# Patient Record
Sex: Female | Born: 1970 | Race: Black or African American | Hispanic: No | Marital: Married | Smoking: Current every day smoker
Health system: Southern US, Community
[De-identification: ages and names within clinical notes are randomized; demographics above are authoritative.]

## PROBLEM LIST (undated history)

## (undated) DIAGNOSIS — S42302A Unspecified fracture of shaft of humerus, left arm, initial encounter for closed fracture: Secondary | ICD-10-CM

## (undated) DIAGNOSIS — F1010 Alcohol abuse, uncomplicated: Secondary | ICD-10-CM

## (undated) DIAGNOSIS — D649 Anemia, unspecified: Secondary | ICD-10-CM

## (undated) DIAGNOSIS — S42301A Unspecified fracture of shaft of humerus, right arm, initial encounter for closed fracture: Secondary | ICD-10-CM

## (undated) DIAGNOSIS — S8291XA Unspecified fracture of right lower leg, initial encounter for closed fracture: Secondary | ICD-10-CM

## (undated) HISTORY — PX: ELBOW SURGERY: SHX618

## (undated) HISTORY — DX: Unspecified fracture of shaft of humerus, right arm, initial encounter for closed fracture: S42.301A

## (undated) HISTORY — DX: Unspecified fracture of shaft of humerus, left arm, initial encounter for closed fracture: S42.302A

## (undated) HISTORY — DX: Anemia, unspecified: D64.9

## (undated) HISTORY — DX: Unspecified fracture of right lower leg, initial encounter for closed fracture: S82.91XA

---

## 2000-06-16 ENCOUNTER — Encounter: Payer: Self-pay | Admitting: Emergency Medicine

## 2000-06-16 ENCOUNTER — Emergency Department (HOSPITAL_COMMUNITY): Admission: EM | Admit: 2000-06-16 | Discharge: 2000-06-16 | Payer: Self-pay | Admitting: Emergency Medicine

## 2006-06-02 ENCOUNTER — Emergency Department: Payer: Self-pay | Admitting: Emergency Medicine

## 2006-12-08 ENCOUNTER — Emergency Department: Payer: Self-pay | Admitting: Unknown Physician Specialty

## 2007-03-03 ENCOUNTER — Inpatient Hospital Stay: Payer: Self-pay | Admitting: Specialist

## 2008-01-08 ENCOUNTER — Inpatient Hospital Stay: Payer: Self-pay | Admitting: Specialist

## 2008-06-19 ENCOUNTER — Ambulatory Visit: Payer: Self-pay | Admitting: Specialist

## 2014-11-02 ENCOUNTER — Emergency Department: Payer: Self-pay | Admitting: Emergency Medicine

## 2017-06-20 ENCOUNTER — Encounter
Admission: EM | Disposition: A | Discharge status: Home Health | Payer: Self-pay | Source: Home / Self Care | Attending: Orthopedic Surgery

## 2017-06-20 ENCOUNTER — Inpatient Hospital Stay: Payer: Medicaid Other | Admitting: Anesthesiology

## 2017-06-20 ENCOUNTER — Encounter: Payer: Self-pay | Admitting: Emergency Medicine

## 2017-06-20 ENCOUNTER — Inpatient Hospital Stay: Payer: Medicaid Other

## 2017-06-20 ENCOUNTER — Inpatient Hospital Stay
Admission: EM | Admit: 2017-06-20 | Discharge: 2017-06-23 | DRG: 493 | Disposition: A | Discharge status: Home Health | Payer: Medicaid Other | Attending: Orthopedic Surgery | Admitting: Orthopedic Surgery

## 2017-06-20 ENCOUNTER — Emergency Department: Payer: Medicaid Other

## 2017-06-20 DIAGNOSIS — S82451A Displaced comminuted fracture of shaft of right fibula, initial encounter for closed fracture: Secondary | ICD-10-CM | POA: Diagnosis present

## 2017-06-20 DIAGNOSIS — T148XXA Other injury of unspecified body region, initial encounter: Secondary | ICD-10-CM

## 2017-06-20 DIAGNOSIS — S82209A Unspecified fracture of shaft of unspecified tibia, initial encounter for closed fracture: Secondary | ICD-10-CM | POA: Diagnosis present

## 2017-06-20 DIAGNOSIS — Z01811 Encounter for preprocedural respiratory examination: Secondary | ICD-10-CM

## 2017-06-20 DIAGNOSIS — F10239 Alcohol dependence with withdrawal, unspecified: Secondary | ICD-10-CM | POA: Diagnosis not present

## 2017-06-20 DIAGNOSIS — S82251A Displaced comminuted fracture of shaft of right tibia, initial encounter for closed fracture: Principal | ICD-10-CM | POA: Diagnosis present

## 2017-06-20 DIAGNOSIS — Y92410 Unspecified street and highway as the place of occurrence of the external cause: Secondary | ICD-10-CM

## 2017-06-20 DIAGNOSIS — D509 Iron deficiency anemia, unspecified: Secondary | ICD-10-CM | POA: Diagnosis present

## 2017-06-20 DIAGNOSIS — F10229 Alcohol dependence with intoxication, unspecified: Secondary | ICD-10-CM | POA: Diagnosis present

## 2017-06-20 DIAGNOSIS — E876 Hypokalemia: Secondary | ICD-10-CM | POA: Diagnosis not present

## 2017-06-20 DIAGNOSIS — F17210 Nicotine dependence, cigarettes, uncomplicated: Secondary | ICD-10-CM | POA: Diagnosis present

## 2017-06-20 DIAGNOSIS — Y907 Blood alcohol level of 200-239 mg/100 ml: Secondary | ICD-10-CM | POA: Diagnosis present

## 2017-06-20 HISTORY — PX: TIBIA IM NAIL INSERTION: SHX2516

## 2017-06-20 HISTORY — DX: Alcohol abuse, uncomplicated: F10.10

## 2017-06-20 LAB — BASIC METABOLIC PANEL
ANION GAP: 4 — AB (ref 5–15)
Anion gap: 9 (ref 5–15)
BUN: 7 mg/dL (ref 6–20)
BUN: 7 mg/dL (ref 6–20)
CO2: 23 mmol/L (ref 22–32)
CO2: 27 mmol/L (ref 22–32)
CREATININE: 0.7 mg/dL (ref 0.44–1.00)
Calcium: 8.3 mg/dL — ABNORMAL LOW (ref 8.9–10.3)
Calcium: 8.1 mg/dL — ABNORMAL LOW (ref 8.9–10.3)
Chloride: 110 mmol/L (ref 101–111)
Chloride: 109 mmol/L (ref 101–111)
Creatinine, Ser: 0.62 mg/dL (ref 0.44–1.00)
GFR calc Af Amer: >60 mL/min (ref >60)
GFR calc non Af Amer: >60 mL/min (ref >60)
GLUCOSE: 94 mg/dL (ref 65–99)
Glucose, Bld: 111 mg/dL — ABNORMAL HIGH (ref 65–99)
POTASSIUM: 3.5 mmol/L (ref 3.5–5.1)
Potassium: 3.4 mmol/L — ABNORMAL LOW (ref 3.5–5.1)
SODIUM: 140 mmol/L (ref 135–145)
Sodium: 142 mmol/L (ref 135–145)

## 2017-06-20 LAB — ETHANOL: Alcohol, Ethyl (B): 207 mg/dL — ABNORMAL HIGH (ref <10)

## 2017-06-20 LAB — APTT: aPTT: 27 seconds (ref 24–36)

## 2017-06-20 LAB — URINE DRUG SCREEN, QUALITATIVE (ARMC ONLY)
Amphetamines, Ur Screen: NOT DETECTED
Barbiturates, Ur Screen: NOT DETECTED
Benzodiazepine, Ur Scrn: NOT DETECTED
CANNABINOID 50 NG, UR ~~LOC~~: POSITIVE — AB
COCAINE METABOLITE, UR ~~LOC~~: NOT DETECTED
MDMA (ECSTASY) UR SCREEN: NOT DETECTED
Methadone Scn, Ur: NOT DETECTED
Opiate, Ur Screen: POSITIVE — AB
Phencyclidine (PCP) Ur S: NOT DETECTED
TRICYCLIC, UR SCREEN: NOT DETECTED

## 2017-06-20 LAB — CBC WITH DIFFERENTIAL/PLATELET
BASOS PCT: 1 %
Basophils Absolute: 0 10*3/uL (ref 0–0.1)
EOS ABS: 0.1 10*3/uL (ref 0–0.7)
EOS PCT: 2 %
HCT: 27.3 % — ABNORMAL LOW (ref 35.0–47.0)
HEMOGLOBIN: 8.4 g/dL — AB (ref 12.0–16.0)
LYMPHS ABS: 1.3 10*3/uL (ref 1.0–3.6)
Lymphocytes Relative: 36 %
MCH: 23.7 pg — AB (ref 26.0–34.0)
MCHC: 30.8 g/dL — AB (ref 32.0–36.0)
MCV: 77.1 fL — AB (ref 80.0–100.0)
MONOS PCT: 13 %
Monocytes Absolute: 0.5 10*3/uL (ref 0.2–0.9)
NEUTROS PCT: 48 %
Neutro Abs: 1.7 10*3/uL (ref 1.4–6.5)
Platelets: 321 10*3/uL (ref 150–440)
RBC: 3.54 MIL/uL — ABNORMAL LOW (ref 3.80–5.20)
RDW: 18.3 % — ABNORMAL HIGH (ref 11.5–14.5)
WBC: 3.6 10*3/uL (ref 3.6–11.0)

## 2017-06-20 LAB — PROTIME-INR
INR: 1.04
PROTHROMBIN TIME: 13.5 s (ref 11.4–15.2)

## 2017-06-20 LAB — CBC
HCT: 26.2 % — ABNORMAL LOW (ref 35.0–47.0)
Hemoglobin: 8.3 g/dL — ABNORMAL LOW (ref 12.0–16.0)
MCH: 24.1 pg — ABNORMAL LOW (ref 26.0–34.0)
MCHC: 31.6 g/dL — ABNORMAL LOW (ref 32.0–36.0)
MCV: 76.3 fL — AB (ref 80.0–100.0)
PLATELETS: 351 10*3/uL (ref 150–440)
RBC: 3.44 MIL/uL — AB (ref 3.80–5.20)
RDW: 18.4 % — ABNORMAL HIGH (ref 11.5–14.5)
WBC: 6 10*3/uL (ref 3.6–11.0)

## 2017-06-20 LAB — SURGICAL PCR SCREEN
MRSA, PCR: NEGATIVE
Staphylococcus aureus: POSITIVE — AB

## 2017-06-20 LAB — PREGNANCY, URINE: Preg Test, Ur: NEGATIVE

## 2017-06-20 SURGERY — INSERTION, INTRAMEDULLARY ROD, TIBIA
Anesthesia: General | Site: Leg Lower | Laterality: Right | Wound class: Clean

## 2017-06-20 MED ORDER — LORAZEPAM 2 MG/ML IJ SOLN: 1.0000 mg | Freq: Four times a day (QID) | INTRAMUSCULAR | Status: AC | PRN

## 2017-06-20 MED ORDER — FLEET ENEMA 7-19 GM/118ML RE ENEM: 1.0000 | ENEMA | Freq: Once | RECTAL | Status: DC | PRN

## 2017-06-20 MED ORDER — GLYCOPYRROLATE 0.2 MG/ML IJ SOLN
INTRAMUSCULAR | Status: AC
  Filled 2017-06-20: qty 1

## 2017-06-20 MED ORDER — SODIUM CHLORIDE 0.9 % IV SOLN
75.0000 mL/h | INTRAVENOUS | Status: DC
  Administered 2017-06-20: 75 mL/h via INTRAVENOUS

## 2017-06-20 MED ORDER — GLYCOPYRROLATE 0.2 MG/ML IJ SOLN
INTRAMUSCULAR | Status: DC | PRN
  Administered 2017-06-20: 0.2 mg via INTRAVENOUS

## 2017-06-20 MED ORDER — ACETAMINOPHEN 650 MG RE SUPP: 650.0000 mg | Freq: Four times a day (QID) | RECTAL | Status: DC | PRN

## 2017-06-20 MED ORDER — FOLIC ACID 1 MG PO TABS
1.0000 mg | ORAL_TABLET | Freq: Every day | ORAL | Status: DC
  Administered 2017-06-21 – 2017-06-23 (×3): 1 mg via ORAL
  Filled 2017-06-20 (×3): qty 1

## 2017-06-20 MED ORDER — ACETAMINOPHEN 325 MG PO TABS: 650.0000 mg | ORAL_TABLET | Freq: Four times a day (QID) | ORAL | Status: DC | PRN

## 2017-06-20 MED ORDER — LIDOCAINE HCL (PF) 2 % IJ SOLN
INTRAMUSCULAR | Status: AC
  Filled 2017-06-20: qty 10

## 2017-06-20 MED ORDER — CHLORHEXIDINE GLUCONATE CLOTH 2 % EX PADS
6.0000 | MEDICATED_PAD | Freq: Every day | CUTANEOUS | Status: DC
  Administered 2017-06-20: 6 via TOPICAL

## 2017-06-20 MED ORDER — ONDANSETRON HCL 4 MG/2ML IJ SOLN
4.0000 mg | Freq: Four times a day (QID) | INTRAMUSCULAR | Status: DC | PRN
Start: 2017-06-20 — End: 2017-06-23

## 2017-06-20 MED ORDER — DEXTROSE-NACL 5-0.9 % IV SOLN
INTRAVENOUS | Status: DC
  Administered 2017-06-20: 04:00:00 via INTRAVENOUS

## 2017-06-20 MED ORDER — MAGNESIUM CITRATE PO SOLN: 1.0000 | Freq: Once | ORAL | Status: DC | PRN

## 2017-06-20 MED ORDER — DEXAMETHASONE SODIUM PHOSPHATE 10 MG/ML IJ SOLN
INTRAMUSCULAR | Status: DC | PRN
  Administered 2017-06-20: 10 mg via INTRAVENOUS

## 2017-06-20 MED ORDER — DEXAMETHASONE SODIUM PHOSPHATE 10 MG/ML IJ SOLN
INTRAMUSCULAR | Status: AC
  Filled 2017-06-20: qty 1

## 2017-06-20 MED ORDER — FENTANYL CITRATE (PF) 100 MCG/2ML IJ SOLN
INTRAMUSCULAR | Status: AC
  Filled 2017-06-20: qty 2

## 2017-06-20 MED ORDER — MORPHINE SULFATE (PF) 2 MG/ML IV SOLN: 2.0000 mg | INTRAVENOUS | Status: DC | PRN

## 2017-06-20 MED ORDER — DOCUSATE SODIUM 100 MG PO CAPS
100.0000 mg | ORAL_CAPSULE | Freq: Two times a day (BID) | ORAL | Status: DC
Start: 2017-06-20 — End: 2017-06-23
  Administered 2017-06-20 – 2017-06-23 (×6): 100 mg via ORAL
  Filled 2017-06-20 (×6): qty 1

## 2017-06-20 MED ORDER — PHENYLEPHRINE HCL 10 MG/ML IJ SOLN
INTRAMUSCULAR | Status: DC | PRN
  Administered 2017-06-20 (×2): 100 ug via INTRAVENOUS
  Administered 2017-06-20 (×3): 200 ug via INTRAVENOUS
  Administered 2017-06-20: 100 ug via INTRAVENOUS

## 2017-06-20 MED ORDER — LORAZEPAM 1 MG PO TABS: 1.0000 mg | ORAL_TABLET | Freq: Four times a day (QID) | ORAL | Status: AC | PRN

## 2017-06-20 MED ORDER — METHOCARBAMOL 1000 MG/10ML IJ SOLN
500.0000 mg | Freq: Four times a day (QID) | INTRAVENOUS | Status: DC | PRN
  Filled 2017-06-20: qty 5

## 2017-06-20 MED ORDER — BISACODYL 10 MG RE SUPP: 10.0000 mg | Freq: Every day | RECTAL | Status: DC | PRN

## 2017-06-20 MED ORDER — POTASSIUM CHLORIDE CRYS ER 20 MEQ PO TBCR
40.0000 meq | EXTENDED_RELEASE_TABLET | Freq: Once | ORAL | Status: AC
  Administered 2017-06-20: 40 meq via ORAL
  Filled 2017-06-20: qty 2

## 2017-06-20 MED ORDER — FENTANYL CITRATE (PF) 100 MCG/2ML IJ SOLN
INTRAMUSCULAR | Status: DC | PRN
  Administered 2017-06-20 (×3): 50 ug via INTRAVENOUS
  Administered 2017-06-20 (×2): 100 ug via INTRAVENOUS
  Administered 2017-06-20: 50 ug via INTRAVENOUS

## 2017-06-20 MED ORDER — DOCUSATE SODIUM 100 MG PO CAPS: 100.0000 mg | ORAL_CAPSULE | Freq: Two times a day (BID) | ORAL | Status: DC

## 2017-06-20 MED ORDER — OXYCODONE HCL 5 MG PO TABS
10.0000 mg | ORAL_TABLET | ORAL | Status: DC | PRN
  Administered 2017-06-21 – 2017-06-22 (×3): 10 mg via ORAL
  Filled 2017-06-20 (×3): qty 2

## 2017-06-20 MED ORDER — SODIUM CHLORIDE 0.9 % IV SOLN
INTRAVENOUS | Status: DC | PRN
  Administered 2017-06-20: 50 ug/min via INTRAVENOUS

## 2017-06-20 MED ORDER — CEFAZOLIN SODIUM-DEXTROSE 2-4 GM/100ML-% IV SOLN: 2.0000 g | INTRAVENOUS | Status: DC

## 2017-06-20 MED ORDER — HYDROMORPHONE HCL 1 MG/ML IJ SOLN
INTRAMUSCULAR | Status: AC
  Filled 2017-06-20: qty 1

## 2017-06-20 MED ORDER — ONDANSETRON HCL 4 MG PO TABS: 4.0000 mg | ORAL_TABLET | Freq: Four times a day (QID) | ORAL | Status: DC | PRN

## 2017-06-20 MED ORDER — SENNA 8.6 MG PO TABS
1.0000 | ORAL_TABLET | Freq: Two times a day (BID) | ORAL | Status: DC
  Administered 2017-06-20 – 2017-06-23 (×6): 8.6 mg via ORAL
  Filled 2017-06-20 (×6): qty 1

## 2017-06-20 MED ORDER — CEFAZOLIN SODIUM 10 G IJ SOLR
2.0000 g | INTRAMUSCULAR | Status: AC
  Administered 2017-06-20: 2 g via INTRAVENOUS
  Filled 2017-06-20: qty 2000

## 2017-06-20 MED ORDER — PROPOFOL 10 MG/ML IV BOLUS
INTRAVENOUS | Status: DC | PRN
  Administered 2017-06-20: 170 mg via INTRAVENOUS
  Administered 2017-06-20 (×2): 30 mg via INTRAVENOUS

## 2017-06-20 MED ORDER — METHOCARBAMOL 500 MG PO TABS
500.0000 mg | ORAL_TABLET | Freq: Four times a day (QID) | ORAL | Status: DC | PRN
  Filled 2017-06-20: qty 1

## 2017-06-20 MED ORDER — MIDAZOLAM HCL 2 MG/2ML IJ SOLN
INTRAMUSCULAR | Status: AC
  Filled 2017-06-20: qty 2

## 2017-06-20 MED ORDER — HYDROMORPHONE HCL 1 MG/ML IJ SOLN
INTRAMUSCULAR | Status: DC | PRN
  Administered 2017-06-20 (×2): 0.5 mg via INTRAVENOUS

## 2017-06-20 MED ORDER — ONDANSETRON HCL 4 MG/2ML IJ SOLN
INTRAMUSCULAR | Status: DC | PRN
  Administered 2017-06-20: 4 mg via INTRAVENOUS

## 2017-06-20 MED ORDER — ROCURONIUM BROMIDE 50 MG/5ML IV SOLN
INTRAVENOUS | Status: AC
  Filled 2017-06-20: qty 1

## 2017-06-20 MED ORDER — ENOXAPARIN SODIUM 40 MG/0.4ML ~~LOC~~ SOLN
40.0000 mg | SUBCUTANEOUS | Status: DC
  Administered 2017-06-21 – 2017-06-23 (×3): 40 mg via SUBCUTANEOUS
  Filled 2017-06-20 (×3): qty 0.4

## 2017-06-20 MED ORDER — OXYCODONE HCL 5 MG PO TABS
5.0000 mg | ORAL_TABLET | ORAL | Status: DC | PRN
  Administered 2017-06-20: 10 mg via ORAL
  Filled 2017-06-20: qty 2

## 2017-06-20 MED ORDER — NEOMYCIN-POLYMYXIN B GU 40-200000 IR SOLN
Status: DC | PRN
  Administered 2017-06-20: 4 mL

## 2017-06-20 MED ORDER — MUPIROCIN 2 % EX OINT
1.0000 "application " | TOPICAL_OINTMENT | Freq: Two times a day (BID) | CUTANEOUS | Status: DC
  Filled 2017-06-20: qty 22

## 2017-06-20 MED ORDER — PROPOFOL 10 MG/ML IV BOLUS
INTRAVENOUS | Status: AC
  Filled 2017-06-20: qty 20

## 2017-06-20 MED ORDER — POLYETHYLENE GLYCOL 3350 17 G PO PACK
17.0000 g | PACK | Freq: Every day | ORAL | Status: DC | PRN
  Administered 2017-06-21 – 2017-06-23 (×2): 17 g via ORAL
  Filled 2017-06-20 (×2): qty 1

## 2017-06-20 MED ORDER — ONDANSETRON HCL 4 MG/2ML IJ SOLN
INTRAMUSCULAR | Status: AC
  Filled 2017-06-20: qty 2

## 2017-06-20 MED ORDER — METHOCARBAMOL 500 MG PO TABS
500.0000 mg | ORAL_TABLET | Freq: Four times a day (QID) | ORAL | Status: DC | PRN
  Administered 2017-06-21: 500 mg via ORAL
  Filled 2017-06-20: qty 1

## 2017-06-20 MED ORDER — ADULT MULTIVITAMIN W/MINERALS CH
1.0000 | ORAL_TABLET | Freq: Every day | ORAL | Status: DC
  Administered 2017-06-21 – 2017-06-23 (×3): 1 via ORAL
  Filled 2017-06-20 (×3): qty 1

## 2017-06-20 MED ORDER — MORPHINE SULFATE (PF) 2 MG/ML IV SOLN
2.0000 mg | Freq: Once | INTRAVENOUS | Status: AC
  Administered 2017-06-20: 2 mg via INTRAVENOUS
  Filled 2017-06-20: qty 1

## 2017-06-20 MED ORDER — VITAMIN B-1 100 MG PO TABS
100.0000 mg | ORAL_TABLET | Freq: Every day | ORAL | Status: DC
  Administered 2017-06-22 – 2017-06-23 (×2): 100 mg via ORAL
  Filled 2017-06-20 (×2): qty 1

## 2017-06-20 MED ORDER — ONDANSETRON HCL 4 MG/2ML IJ SOLN
4.0000 mg | INTRAMUSCULAR | Status: AC
  Administered 2017-06-20: 4 mg via INTRAVENOUS
  Filled 2017-06-20: qty 2

## 2017-06-20 MED ORDER — ALUM & MAG HYDROXIDE-SIMETH 200-200-20 MG/5ML PO SUSP: 30.0000 mL | ORAL | Status: DC | PRN

## 2017-06-20 MED ORDER — DEXTROSE 5 % IV SOLN
1.0000 g | Freq: Four times a day (QID) | INTRAVENOUS | Status: AC
  Administered 2017-06-20 – 2017-06-21 (×2): 1 g via INTRAVENOUS
  Filled 2017-06-20 (×2): qty 10

## 2017-06-20 MED ORDER — SENNOSIDES-DOCUSATE SODIUM 8.6-50 MG PO TABS: 1.0000 | ORAL_TABLET | Freq: Every evening | ORAL | Status: DC | PRN

## 2017-06-20 MED ORDER — ONDANSETRON HCL 4 MG/2ML IJ SOLN: 4.0000 mg | Freq: Once | INTRAMUSCULAR | Status: DC | PRN

## 2017-06-20 MED ORDER — LIDOCAINE HCL (CARDIAC) 20 MG/ML IV SOLN
INTRAVENOUS | Status: DC | PRN
  Administered 2017-06-20: 100 mg via INTRAVENOUS

## 2017-06-20 MED ORDER — LACTATED RINGERS IV SOLN
INTRAVENOUS | Status: DC | PRN
  Administered 2017-06-20 (×2): via INTRAVENOUS

## 2017-06-20 MED ORDER — LORAZEPAM 2 MG PO TABS
0.0000 mg | ORAL_TABLET | Freq: Two times a day (BID) | ORAL | Status: DC
Start: 2017-06-22 — End: 2017-06-20

## 2017-06-20 MED ORDER — MENTHOL 3 MG MT LOZG: 1.0000 | LOZENGE | OROMUCOSAL | Status: DC | PRN

## 2017-06-20 MED ORDER — THIAMINE HCL 100 MG/ML IJ SOLN
100.0000 mg | Freq: Every day | INTRAMUSCULAR | Status: DC
  Administered 2017-06-21: 100 mg via INTRAVENOUS
  Filled 2017-06-20: qty 2

## 2017-06-20 MED ORDER — SODIUM CHLORIDE 0.9 % IV SOLN: INTRAVENOUS | Status: DC

## 2017-06-20 MED ORDER — PHENOL 1.4 % MT LIQD: 1.0000 | OROMUCOSAL | Status: DC | PRN

## 2017-06-20 MED ORDER — LORAZEPAM 2 MG PO TABS: 0.0000 mg | ORAL_TABLET | Freq: Four times a day (QID) | ORAL | Status: AC

## 2017-06-20 MED ORDER — PROPOFOL 500 MG/50ML IV EMUL
INTRAVENOUS | Status: DC | PRN
  Administered 2017-06-20: 50 ug/kg/min via INTRAVENOUS

## 2017-06-20 MED ORDER — PROPOFOL 500 MG/50ML IV EMUL
INTRAVENOUS | Status: AC
  Filled 2017-06-20: qty 50

## 2017-06-20 MED ORDER — FENTANYL CITRATE (PF) 100 MCG/2ML IJ SOLN: 25.0000 ug | INTRAMUSCULAR | Status: DC | PRN

## 2017-06-20 MED ORDER — EPHEDRINE SULFATE 50 MG/ML IJ SOLN
INTRAMUSCULAR | Status: DC | PRN
  Administered 2017-06-20: 10 mg via INTRAVENOUS

## 2017-06-20 MED ORDER — MIDAZOLAM HCL 2 MG/2ML IJ SOLN
INTRAMUSCULAR | Status: DC | PRN
  Administered 2017-06-20 (×2): 2 mg via INTRAVENOUS

## 2017-06-20 SURGICAL SUPPLY — 57 items
BANDAGE ACE 4X5 VEL STRL LF (GAUZE/BANDAGES/DRESSINGS) ×3 IMPLANT
BANDAGE ELASTIC 4 LF NS (GAUZE/BANDAGES/DRESSINGS) ×6 IMPLANT
BANDAGE ELASTIC 6 LF NS (GAUZE/BANDAGES/DRESSINGS) ×3 IMPLANT
BIT DRILL 3.8X6 NS (BIT) ×3 IMPLANT
BIT DRILL 4.4 NS (BIT) ×3 IMPLANT
BIT DRILL CAL 3.2 LONG (BIT) ×2 IMPLANT
BIT DRILL CAL 3.2MM LONG (BIT) ×1
BNDG CMPR MED 5X4 ELC HKLP NS (GAUZE/BANDAGES/DRESSINGS) ×2
CANISTER SUCT 1200ML W/VALVE (MISCELLANEOUS) ×3 IMPLANT
CAST PADDING 6X4YD ST 30248 (SOFTGOODS) ×2
CUFF TOURN 24 STER (MISCELLANEOUS) ×3 IMPLANT
CUFF TOURN 30 STER DUAL PORT (MISCELLANEOUS) IMPLANT
DRAPE C-ARM XRAY 36X54 (DRAPES) ×3 IMPLANT
DRAPE C-ARMOR (DRAPES) ×3 IMPLANT
DRAPE SHEET LG 3/4 BI-LAMINATE (DRAPES) ×6 IMPLANT
DURAPREP 26ML APPLICATOR (WOUND CARE) ×6 IMPLANT
ELECT REM PT RETURN 9FT ADLT (ELECTROSURGICAL) ×3
ELECTRODE REM PT RTRN 9FT ADLT (ELECTROSURGICAL) ×1 IMPLANT
EVACUATOR 1/8 PVC DRAIN (DRAIN) ×3 IMPLANT
GAUZE PETRO XEROFOAM 1X8 (MISCELLANEOUS) ×6 IMPLANT
GAUZE SPONGE 4X4 12PLY STRL (GAUZE/BANDAGES/DRESSINGS) ×3 IMPLANT
GAUZE XEROFORM 4X4 STRL (GAUZE/BANDAGES/DRESSINGS) ×6 IMPLANT
GLOVE BIOGEL PI IND STRL 9 (GLOVE) ×1 IMPLANT
GLOVE BIOGEL PI INDICATOR 9 (GLOVE) ×2
GLOVE SURG 9.0 ORTHO LTXF (GLOVE) ×6 IMPLANT
GOWN STRL REUS TWL 2XL XL LVL4 (GOWN DISPOSABLE) ×3 IMPLANT
GOWN STRL REUS W/ TWL LRG LVL3 (GOWN DISPOSABLE) ×1 IMPLANT
GOWN STRL REUS W/TWL LRG LVL3 (GOWN DISPOSABLE) ×2
GUIDEPIN 3.2X17.5 THRD DISP (PIN) ×3 IMPLANT
GUIDEWIRE BALL NOSE 80CM (WIRE) ×3 IMPLANT
KIT RM TURNOVER STRD PROC AR (KITS) ×3 IMPLANT
NAIL TIBIAL 9MMX33CM (Nail) ×3 IMPLANT
NEEDLE FILTER BLUNT 18X 1/2SAF (NEEDLE) ×2
NEEDLE FILTER BLUNT 18X1 1/2 (NEEDLE) ×1 IMPLANT
NS IRRIG 1000ML POUR BTL (IV SOLUTION) ×3 IMPLANT
PACK TOTAL KNEE (MISCELLANEOUS) ×3 IMPLANT
PADDING CAST 6X4YD NS (MISCELLANEOUS) ×4
PADDING CAST BLEND 4X4 NS (MISCELLANEOUS) ×6 IMPLANT
PADDING CAST COTTON 6X4 NS (MISCELLANEOUS) ×2 IMPLANT
PADDING CAST COTTON 6X4 ST (SOFTGOODS) ×1 IMPLANT
REAMER ROD DEEP FLUTE 2.5X950 (INSTRUMENTS) ×3 IMPLANT
SCREW ×3 IMPLANT
SCREW ACECAP 30MM (Screw) ×3 IMPLANT
SCREW PROXIMAL DEPUY (Screw) ×3 IMPLANT
SCREW PRXML FT 40X5.5XNS LF (Screw) ×1 IMPLANT
SPLINT CAST 1 STEP 4X30 (MISCELLANEOUS) ×12 IMPLANT
SPLINT CAST 1 STEP 5X30 WHT (MISCELLANEOUS) ×6 IMPLANT
SPONGE XRAY 4X4 16PLY STRL (MISCELLANEOUS) ×3 IMPLANT
STAPLER SKIN PROX 35W (STAPLE) ×3 IMPLANT
SUT ETHILON 4-0 (SUTURE) ×3
SUT ETHILON 4-0 FS2 18XMFL BLK (SUTURE) ×1
SUT MNCRL AB 3-0 PS2 27 (SUTURE) ×3 IMPLANT
SUT VIC AB 0 CT2 27 (SUTURE) ×3 IMPLANT
SUT VIC AB 2-0 CT2 27 (SUTURE) ×3 IMPLANT
SUTURE ETHLN 4-0 FS2 18XMF BLK (SUTURE) ×1 IMPLANT
SYRINGE 10CC LL (SYRINGE) ×3 IMPLANT
WATER STERILE IRR 1000ML POUR (IV SOLUTION) ×6 IMPLANT
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
## 2017-06-20 NOTE — Anesthesia Post-op Follow-up Note (Signed)
Anesthesia QCDR form completed.        

## 2017-06-20 NOTE — ED Notes (Signed)
Admitting Provider at bedside. 
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
## 2017-06-20 NOTE — Anesthesia Preprocedure Evaluation (Signed)
Anesthesia Evaluation  Patient identified by MRN, date of birth, ID band Patient awake    Reviewed: Allergy & Precautions, H&P , NPO status , Patient's Chart, lab work & pertinent test results, reviewed documented beta blocker date and time   Airway Mallampati: II  TM Distance: >3 FB Neck ROM: full    Dental  (+) Teeth Intact   Pulmonary neg pulmonary ROS, Current Smoker,    Pulmonary exam normal        Cardiovascular Exercise Tolerance: Good negative cardio ROS Normal cardiovascular exam Rate:Normal     Neuro/Psych PSYCHIATRIC DISORDERS Etoh abusenegative neurological ROS  negative psych ROS   GI/Hepatic negative GI ROS, Neg liver ROS,   Endo/Other  negative endocrine ROS  Renal/GU negative Renal ROS  negative genitourinary   Musculoskeletal   Abdominal   Peds  Hematology negative hematology ROS (+)   Anesthesia Other Findings   Reproductive/Obstetrics negative OB ROS                             Anesthesia Physical Anesthesia Plan  ASA: II  Anesthesia Plan: General LMA   Post-op Pain Management:    Induction:   PONV Risk Score and Plan: 4 or greater and Ondansetron, Dexamethasone, Midazolam and Propofol infusion  Airway Management Planned:   Additional Equipment:   Intra-op Plan:   Post-operative Plan:   Informed Consent: I have reviewed the patients History and Physical, chart, labs and discussed the procedure including the risks, benefits and alternatives for the proposed anesthesia with the patient or authorized representative who has indicated his/her understanding and acceptance.     Plan Discussed with: CRNA  Anesthesia Plan Comments:         Anesthesia Quick Evaluation

## 2017-06-20 NOTE — Anesthesia Procedure Notes (Signed)
Procedure Name: LMA Insertion Date/Time: 06/20/2017 4:33 PM Performed by: Ginger CarneMICHELET, Master Touchet Pre-anesthesia Checklist: Patient identified, Emergency Drugs available, Suction available, Patient being monitored and Timeout performed Patient Re-evaluated:Patient Re-evaluated prior to induction Oxygen Delivery Method: Circle system utilized Preoxygenation: Pre-oxygenation with 100% oxygen Induction Type: IV induction LMA: LMA inserted LMA Size: 3.5 Tube type: Oral Placement Confirmation: positive ETCO2 and breath sounds checked- equal and bilateral Tube secured with: Tape Dental Injury: Teeth and Oropharynx as per pre-operative assessment

## 2017-06-20 NOTE — Transfer of Care (Signed)
Immediate Anesthesia Transfer of Care Note  Patient: Tricia Norton  Procedure(s) Performed: INTRAMEDULLARY (IM) NAIL TIBIAL (Right Leg Lower)  Patient Location: PACU  Anesthesia Type:General  Level of Consciousness: sedated  Airway & Oxygen Therapy: Patient connected to face mask oxygen  Post-op Assessment: Post -op Vital signs reviewed and stable  Post vital signs: stable  Last Vitals:  Vitals:   06/20/17 0803 06/20/17 1847  BP: 128/82 109/75  Pulse: (!) 101 88  Resp:  12  Temp: 37.1 C 36.8 C  SpO2: 99% 100%    Last Pain:  Vitals:   06/20/17 1847  TempSrc: Tympanic  PainSc:          Complications: No apparent anesthesia complications

## 2017-06-21 ENCOUNTER — Encounter: Payer: Self-pay | Admitting: Orthopedic Surgery

## 2017-06-21 LAB — BASIC METABOLIC PANEL
ANION GAP: 7 (ref 5–15)
BUN: 5 mg/dL — ABNORMAL LOW (ref 6–20)
CALCIUM: 7.9 mg/dL — AB (ref 8.9–10.3)
CO2: 24 mmol/L (ref 22–32)
CREATININE: 0.56 mg/dL (ref 0.44–1.00)
Chloride: 104 mmol/L (ref 101–111)
Glucose, Bld: 140 mg/dL — ABNORMAL HIGH (ref 65–99)
Potassium: 4 mmol/L (ref 3.5–5.1)
SODIUM: 135 mmol/L (ref 135–145)

## 2017-06-21 LAB — CBC
HEMATOCRIT: 24.9 % — AB (ref 35.0–47.0)
Hemoglobin: 7.8 g/dL — ABNORMAL LOW (ref 12.0–16.0)
MCH: 23.8 pg — ABNORMAL LOW (ref 26.0–34.0)
MCHC: 31.2 g/dL — AB (ref 32.0–36.0)
MCV: 76.3 fL — ABNORMAL LOW (ref 80.0–100.0)
PLATELETS: 310 10*3/uL (ref 150–440)
RBC: 3.26 MIL/uL — ABNORMAL LOW (ref 3.80–5.20)
RDW: 18.2 % — AB (ref 11.5–14.5)
WBC: 8.9 10*3/uL (ref 3.6–11.0)

## 2017-06-21 LAB — HIV ANTIBODY (ROUTINE TESTING W REFLEX): HIV Screen 4th Generation wRfx: NONREACTIVE

## 2017-06-21 LAB — URINE CULTURE: CULTURE: NO GROWTH

## 2017-06-21 MED ORDER — CHLORHEXIDINE GLUCONATE CLOTH 2 % EX PADS
6.0000 | MEDICATED_PAD | Freq: Every day | CUTANEOUS | Status: DC
Start: 2017-06-21 — End: 2017-06-23
  Administered 2017-06-21 – 2017-06-22 (×2): 6 via TOPICAL

## 2017-06-21 MED ORDER — MUPIROCIN 2 % EX OINT
1.0000 | TOPICAL_OINTMENT | Freq: Two times a day (BID) | CUTANEOUS | Status: DC
Start: 2017-06-21 — End: 2017-06-23
  Administered 2017-06-21 – 2017-06-22 (×5): 1 via NASAL
  Filled 2017-06-21: qty 22

## 2017-06-21 NOTE — Progress Notes (Signed)
Pt can turn and tilt self.
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
## 2017-06-21 NOTE — Anesthesia Postprocedure Evaluation (Signed)
Anesthesia Post Note  Patient: Sunny SchleinMonique L Dagley  Procedure(s) Performed: INTRAMEDULLARY (IM) NAIL TIBIAL (Right Leg Lower)  Patient location during evaluation: PACU Anesthesia Type: General Level of consciousness: awake and alert Pain management: pain level controlled Vital Signs Assessment: post-procedure vital signs reviewed and stable Respiratory status: spontaneous breathing, nonlabored ventilation, respiratory function stable and patient connected to nasal cannula oxygen Cardiovascular status: blood pressure returned to baseline and stable Postop Assessment: no apparent nausea or vomiting Anesthetic complications: no     Last Vitals:  Vitals:   06/20/17 2101 06/20/17 2202  BP: 120/80 140/80  Pulse: 82 95  Resp:    Temp:    SpO2: 97% 97%    Last Pain:  Vitals:   06/21/17 0000  TempSrc:   PainSc: Asleep                 Yevette EdwardsJames G Colon Rueth

## 2017-06-21 NOTE — Progress Notes (Signed)
Rept to Dr. Luberta MutterKonidena regarding pt's hemoglobin 7.8 today. Pt vital signs remain stable. BP 137/79 P 86. No s/sx distress related to hgb. No new orders. Will continue to monitor.

## 2017-06-21 NOTE — Progress Notes (Signed)
Rept to Dr. Martha ClanKrasinski. Pt hgb today 7.8. Pt was able to get up with therapy with orthostatic hypotension or dizziness. Pt's vital signs remain stable. Urinary output stable. No new orders at this time. Will continue to monitore.
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
## 2017-06-22 ENCOUNTER — Inpatient Hospital Stay: Payer: Medicaid Other

## 2017-06-22 ENCOUNTER — Encounter: Payer: Self-pay | Admitting: Radiology

## 2017-06-22 LAB — HEMOGLOBIN AND HEMATOCRIT, BLOOD
HEMATOCRIT: 23.8 % — AB (ref 35.0–47.0)
HEMATOCRIT: 31.4 % — AB (ref 35.0–47.0)
HEMOGLOBIN: 7.3 g/dL — AB (ref 12.0–16.0)
HEMOGLOBIN: 9.9 g/dL — AB (ref 12.0–16.0)

## 2017-06-22 LAB — CBC
HEMATOCRIT: 22.7 % — AB (ref 35.0–47.0)
HEMOGLOBIN: 7.2 g/dL — AB (ref 12.0–16.0)
MCH: 24.1 pg — AB (ref 26.0–34.0)
MCHC: 31.9 g/dL — AB (ref 32.0–36.0)
MCV: 75.6 fL — AB (ref 80.0–100.0)
Platelets: 281 10*3/uL (ref 150–440)
RBC: 3 MIL/uL — ABNORMAL LOW (ref 3.80–5.20)
RDW: 18 % — ABNORMAL HIGH (ref 11.5–14.5)
WBC: 7.6 10*3/uL (ref 3.6–11.0)

## 2017-06-22 LAB — IRON AND TIBC
IRON: 6 ug/dL — AB (ref 28–170)
Saturation Ratios: 2 % — ABNORMAL LOW (ref 10.4–31.8)
TIBC: 310 ug/dL (ref 250–450)
UIBC: 304 ug/dL

## 2017-06-22 LAB — TYPE AND SCREEN
ABO/RH(D): A POS
ANTIBODY SCREEN: NEGATIVE

## 2017-06-22 LAB — BASIC METABOLIC PANEL
Anion gap: 7 (ref 5–15)
CHLORIDE: 101 mmol/L (ref 101–111)
CO2: 25 mmol/L (ref 22–32)
CREATININE: 0.51 mg/dL (ref 0.44–1.00)
Calcium: 8.2 mg/dL — ABNORMAL LOW (ref 8.9–10.3)
GFR calc Af Amer: >60 mL/min (ref >60)
GFR calc non Af Amer: >60 mL/min (ref >60)
Glucose, Bld: 109 mg/dL — ABNORMAL HIGH (ref 65–99)
POTASSIUM: 3.1 mmol/L — AB (ref 3.5–5.1)
Sodium: 133 mmol/L — ABNORMAL LOW (ref 135–145)

## 2017-06-22 LAB — MAGNESIUM: MAGNESIUM: 1.8 mg/dL (ref 1.7–2.4)

## 2017-06-22 LAB — PREPARE RBC (CROSSMATCH)

## 2017-06-22 LAB — FOLATE: Folate: 8.4 ng/mL (ref >5.9)

## 2017-06-22 LAB — FERRITIN: Ferritin: 11 ng/mL (ref 11–307)

## 2017-06-22 LAB — ABO/RH: ABO/RH(D): A POS

## 2017-06-22 MED ORDER — SODIUM CHLORIDE 0.9 % IV SOLN: Freq: Once | INTRAVENOUS | Status: DC

## 2017-06-22 MED ORDER — POTASSIUM CHLORIDE CRYS ER 20 MEQ PO TBCR
40.0000 meq | EXTENDED_RELEASE_TABLET | ORAL | Status: AC
  Administered 2017-06-22 (×2): 40 meq via ORAL
  Filled 2017-06-22 (×2): qty 2

## 2017-06-22 MED ORDER — IOPAMIDOL (ISOVUE-300) INJECTION 61%
100.0000 mL | Freq: Once | INTRAVENOUS | Status: AC | PRN
  Administered 2017-06-22: 100 mL via INTRAVENOUS

## 2017-06-22 MED ORDER — SODIUM CHLORIDE 0.9 % IV SOLN
Freq: Once | INTRAVENOUS | Status: AC
  Administered 2017-06-22: 11:00:00 via INTRAVENOUS

## 2017-06-22 NOTE — Progress Notes (Signed)
PT Cancellation Note  Patient Details Name: Tricia SchleinMonique L Norton MRN: [id number] DOB: 06-15-1971   Cancelled Treatment:    Reason Eval/Treat Not Completed: Other (comment).  Attempted to see pt for PT; however pt eating a late lunch and requests that PT return at a later time.  Will attempt to see pt again later today, schedule permitting.    Encarnacion ChuAshley Abashian PT, DPT 06/22/2017, 3:05 PM

## 2017-06-22 NOTE — Progress Notes (Signed)
PT Cancellation Note  Patient Details Name: Tricia Norton MRN: [id number] DOB: Sep 10, 1970   Cancelled Treatment:    Reason Eval/Treat Not Completed: Patient at procedure or test/unavailable (Pt currently off floor for ultrasound).  Will continue to follow acutely.    [name] PT, DPT 06/22/2017, 3:38 PM
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
## 2017-06-22 NOTE — Care Management Note (Addendum)
Case Management Note  Patient Details  Name: Tricia Norton MRN: [id number] Date of Birth: 24-Mar-1971  Subjective/Objective:  Referral to [facility] for PT since patient is uninsured. She will need a walker and BSC, ordered from [facility].                   Action/Plan: DME delivered 10/30. Spoke with Corene Cornea with [facility] who has met with patient. She states she does not have a phone. She thinks her mom does but "doesnt know the number". [facility] will attempt to make outreach but it may be futile. She is not very forth coming with information. Numbers on the chart are nonworking numbers  Expected Discharge Date:  06/20/17               Expected Discharge Plan:  [place]  In-House Referral:     Discharge planning Services  CM Consult  Post Acute Care Choice:  Durable Medical Equipment, Home Health Choice offered to:  Patient  DME Arranged:  Bedside commode, Walker rolling DME Agency:  [place]:  PT [facility] Agency:  [place]  Status of Service:  In process, will continue to follow  If discussed at Long Length of Stay Meetings, dates discussed:    Additional Comments:  Jolly Mango, RN 06/22/2017, 9:13 AM

## 2017-06-23 LAB — BASIC METABOLIC PANEL
ANION GAP: 9 (ref 5–15)
CHLORIDE: 101 mmol/L (ref 101–111)
CO2: 26 mmol/L (ref 22–32)
Calcium: 8.6 mg/dL — ABNORMAL LOW (ref 8.9–10.3)
Creatinine, Ser: 0.52 mg/dL (ref 0.44–1.00)
GFR calc non Af Amer: >60 mL/min (ref >60)
Glucose, Bld: 100 mg/dL — ABNORMAL HIGH (ref 65–99)
POTASSIUM: 3.3 mmol/L — AB (ref 3.5–5.1)
SODIUM: 136 mmol/L (ref 135–145)

## 2017-06-23 LAB — TYPE AND SCREEN
ABO/RH(D): A POS
ANTIBODY SCREEN: NEGATIVE
Unit division: 0

## 2017-06-23 LAB — CBC
HCT: 27.2 % — ABNORMAL LOW (ref 35.0–47.0)
HEMOGLOBIN: 9 g/dL — AB (ref 12.0–16.0)
MCH: 25.7 pg — AB (ref 26.0–34.0)
MCHC: 33 g/dL (ref 32.0–36.0)
MCV: 78 fL — ABNORMAL LOW (ref 80.0–100.0)
Platelets: 279 10*3/uL (ref 150–440)
RBC: 3.49 MIL/uL — AB (ref 3.80–5.20)
RDW: 17.8 % — ABNORMAL HIGH (ref 11.5–14.5)
WBC: 8.8 10*3/uL (ref 3.6–11.0)

## 2017-06-23 LAB — BPAM RBC
Blood Product Expiration Date: 201811072359
ISSUE DATE / TIME: 201810311045
UNIT TYPE AND RH: 6200

## 2017-06-23 MED ORDER — ASPIRIN EC 325 MG PO TBEC: 325.0000 mg | DELAYED_RELEASE_TABLET | Freq: Two times a day (BID) | ORAL | 0 refills | Status: AC

## 2017-06-23 MED ORDER — FERROUS SULFATE 325 (65 FE) MG PO TABS: 325.0000 mg | ORAL_TABLET | Freq: Two times a day (BID) | ORAL | 3 refills | Status: DC

## 2017-06-23 MED ORDER — OXYCODONE HCL 5 MG PO TABS: 5.0000 mg | ORAL_TABLET | Freq: Four times a day (QID) | ORAL | 0 refills | Status: DC | PRN

## 2017-06-23 MED ORDER — POTASSIUM CHLORIDE 20 MEQ PO PACK: 40.0000 meq | PACK | Freq: Once | ORAL | Status: DC

## 2017-06-23 MED ORDER — POTASSIUM CHLORIDE 20 MEQ PO PACK: 40.0000 meq | PACK | Freq: Once | ORAL | 0 refills | Status: DC
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
## 2017-06-23 NOTE — Care Management (Signed)
Received call from [facility] s/p discharge. They made an attempt to reach out to patient to schedule home health PT. The number the patient provided was not a valid number for her. They will notify orthopedist.
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
## 2017-06-23 NOTE — Progress Notes (Signed)
Discharge summary reviewed with verbal understanding. 1 narcotic Rx, 3 other Rxs given upon discharge. Returned 3 silvertone rings to patient upon discharge.

## 2017-06-23 NOTE — Care Management Note (Signed)
Case Management Note  Patient Details  Name: Tricia Norton MRN: [id number] Date of Birth: May 01, 1971  Subjective/Objective:   Discharging today                 Action/Plan: Patient reports her moms number is [phone]. Updated Barbara CowerJason with [facility]    Expected Discharge Date:  06/23/17               Expected Discharge Plan:  Home w Home Health Services  In-House Referral:     Discharge planning Services  CM Consult  Post Acute Care Choice:  Durable Medical Equipment, Home Health Choice offered to:  Patient  DME Arranged:  Bedside commode, Walker rolling DME Agency:  [facility]  HH Arranged:  PT, Social Work [facility] [employer]  [facility]  Status of Service:  Completed, signed off  If discussed at [employer] Meetings, dates discussed:    Additional Comments:  Marily MemosLisa M Lawana Hartzell, RN 06/23/2017, 8:43 AM
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
## 2017-06-23 NOTE — Progress Notes (Signed)
Subjective:  POD 3 s/p IM nail for right tibia fracture.  Patient reports pain as mild.  CT scan yesterday demonstrated no intra-abdominal or intrapelvic injury or hematoma.  Patient progressing with physical therapy.  Objective:   VITALS:   Vitals:   06/22/17 1115 06/22/17 1423 06/22/17 1741 06/22/17 2149  BP: 124/73 124/84 137/83 122/86  Pulse: 97 91 (!) 102 100  Resp: 16 16  19   Temp: 98.5 F (36.9 C) 99.4 F (37.4 C) 98.3 F (36.8 C) 99.3 F (37.4 C)  TempSrc: Oral Oral Oral Oral  SpO2: 100% 100% 100% 100%  Weight:      Height:        PHYSICAL EXAM: Right lower extremity:  Dressing clean dry and intact. Patient flex and extend her toes. Her toes well-perfused. She has intact sensation light touch in all 5 toes.   LABS  Results for orders placed or performed during the hospital encounter of 06/20/17 (from the past 24 hour(s))  Type and screen     Status: None   Collection Time: 06/22/17  8:51 AM  Result Value Ref Range   ABO/RH(D) A POS    Antibody Screen NEG    Sample Expiration 06/25/2017   Hemoglobin and Hematocrit     Status: Abnormal   Collection Time: 06/22/17  8:51 AM  Result Value Ref Range   Hemoglobin 7.3 (L) 12.0 - 16.0 g/dL   HCT 13.223.8 (L) 44.035.0 - 10.247.0 %  Prepare RBC     Status: None   Collection Time: 06/22/17  8:51 AM  Result Value Ref Range   Order Confirmation ORDER PROCESSED BY BLOOD BANK   Iron and TIBC     Status: Abnormal   Collection Time: 06/22/17  8:51 AM  Result Value Ref Range   Iron 6 (L) 28 - 170 ug/dL   TIBC 725310 366250 - 440450 ug/dL   Saturation Ratios 2 (L) 10.4 - 31.8 %   UIBC 304 ug/dL  Ferritin     Status: None   Collection Time: 06/22/17  8:51 AM  Result Value Ref Range   Ferritin 11 11 - 307 ng/mL  Folate     Status: None   Collection Time: 06/22/17  8:51 AM  Result Value Ref Range   Folate 8.4 >5.9 ng/mL  Hemoglobin and hematocrit, blood     Status: Abnormal   Collection Time: 06/22/17  4:23 PM  Result Value Ref Range   Hemoglobin 9.9 (L) 12.0 - 16.0 g/dL   HCT 34.731.4 (L) 42.535.0 - 95.647.0 %  CBC     Status: Abnormal   Collection Time: 06/23/17  3:21 AM  Result Value Ref Range   WBC 8.8 3.6 - 11.0 K/uL   RBC 3.49 (L) 3.80 - 5.20 MIL/uL   Hemoglobin 9.0 (L) 12.0 - 16.0 g/dL   HCT 38.727.2 (L) 56.435.0 - 33.247.0 %   MCV 78.0 (L) 80.0 - 100.0 fL   MCH 25.7 (L) 26.0 - 34.0 pg   MCHC 33.0 32.0 - 36.0 g/dL   RDW 95.117.8 (H) 88.411.5 - 16.614.5 %   Platelets 279 150 - 440 K/uL    Ct Abdomen Pelvis W Contrast  Result Date: 06/22/2017 CLINICAL DATA:  Blunt trauma. EXAM: CT ABDOMEN AND PELVIS WITH CONTRAST TECHNIQUE: Multidetector CT imaging of the abdomen and pelvis was performed using the standard protocol following bolus administration of intravenous contrast. CONTRAST:  100mL ISOVUE-300 IOPAMIDOL (ISOVUE-300) INJECTION 61% COMPARISON:  None. FINDINGS: Lower chest: No acute abnormality. Hepatobiliary: No focal liver  abnormality is seen. No gallstones, gallbladder wall thickening, or biliary dilatation. Pancreas: Unremarkable. No pancreatic ductal dilatation or surrounding inflammatory changes. Spleen: Normal in size without focal abnormality. Adrenals/Urinary Tract: Adrenal glands are unremarkable. Kidneys are normal, without renal calculi, focal lesion, or hydronephrosis. Bladder is unremarkable. Stomach/Bowel: Stomach is within normal limits. Appendix appears normal. No evidence of bowel wall thickening, distention, or inflammatory changes. Vascular/Lymphatic: No significant vascular findings are present. No enlarged abdominal or pelvic lymph nodes. Reproductive: Uterus and bilateral adnexa are unremarkable. Other: No abdominal wall hernia or abnormality. No abdominopelvic ascites. Musculoskeletal: No acute or significant osseous findings. IMPRESSION: No significant abnormality is noted in the abdomen or pelvis. Electronically Signed   By: Lupita Raider, M.D.   On: 06/22/2017 15:52    Assessment/Plan: 3 Days Post-Op   Active Problems:    Closed tibia fracture  Patient stable postop.  Hemoglobin improved after 1 unit transfusion yesterday. Hemoglobin of 9 today.  Patient had a hemoglobin of 8.4 on presentation to the ER.  Suspect she is chronically anemic.  She will need follow up with her PCP for further evaluation.  Pain controled. Patient (physical therapy. Patient be discharged home today with plan for follow-up with me in 7-10 days.    Juanell Fairly , MD 06/23/2017, 8:15 AM

## 2017-07-06 ENCOUNTER — Encounter: Payer: Self-pay | Admitting: Gastroenterology

## 2017-07-06 ENCOUNTER — Ambulatory Visit: Payer: Medicaid Other | Admitting: Gastroenterology

## 2017-07-06 VITALS — BP 122/84 | HR 76 | Temp 97.7°F | Ht 66.0 in | Wt 119.8 lb

## 2017-07-06 DIAGNOSIS — N920 Excessive and frequent menstruation with regular cycle: Secondary | ICD-10-CM | POA: Diagnosis not present

## 2017-07-06 DIAGNOSIS — D509 Iron deficiency anemia, unspecified: Secondary | ICD-10-CM | POA: Diagnosis not present

## 2017-07-06 MED ORDER — FERROUS SULFATE 325 (65 FE) MG PO TABS: 325.0000 mg | ORAL_TABLET | Freq: Two times a day (BID) | ORAL | 3 refills | Status: AC

## 2017-07-06 NOTE — Addendum Note (Signed)
Addended by: Ardyth ManARTER, Lincoln Kleiner Z on: 07/06/2017 09:59 AM   Modules accepted: Orders
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
## 2017-07-07 ENCOUNTER — Telehealth: Payer: Self-pay | Admitting: Obstetrics & Gynecology

## 2017-07-07 NOTE — Telephone Encounter (Signed)
[facility] referring for Menorrhagia with regular cycle. Unable to reach patient to schedule appointment.

## 2017-07-07 NOTE — Telephone Encounter (Signed)
[facility] . She will call back to schedule patient.

## 2017-07-20 NOTE — Telephone Encounter (Signed)
Attempted to reach pt voicemail box not set up.

## 2017-07-25 NOTE — Telephone Encounter (Signed)
Letter to be sent

## 2021-05-11 ENCOUNTER — Emergency Department
Admission: EM | Admit: 2021-05-11 | Discharge: 2021-05-11 | Disposition: A | Discharge status: Home / Self Care | Payer: Medicaid Other

## 2021-11-22 ENCOUNTER — Emergency Department: Payer: Medicaid Other

## 2021-11-22 ENCOUNTER — Encounter: Payer: Self-pay | Admitting: Emergency Medicine

## 2021-11-22 ENCOUNTER — Emergency Department
Admission: EM | Admit: 2021-11-22 | Discharge: 2021-11-22 | Disposition: A | Discharge status: Home / Self Care | Payer: Medicaid Other | Attending: Emergency Medicine | Admitting: Emergency Medicine

## 2021-11-22 ENCOUNTER — Other Ambulatory Visit: Payer: Self-pay

## 2021-11-22 DIAGNOSIS — M436 Torticollis: Secondary | ICD-10-CM | POA: Diagnosis not present

## 2021-11-22 DIAGNOSIS — W19XXXA Unspecified fall, initial encounter: Secondary | ICD-10-CM

## 2021-11-22 DIAGNOSIS — W01198A Fall on same level from slipping, tripping and stumbling with subsequent striking against other object, initial encounter: Secondary | ICD-10-CM | POA: Diagnosis not present

## 2021-11-22 DIAGNOSIS — Y92000 Kitchen of unspecified non-institutional (private) residence as  the place of occurrence of the external cause: Secondary | ICD-10-CM | POA: Insufficient documentation

## 2021-11-22 DIAGNOSIS — S0993XA Unspecified injury of face, initial encounter: Secondary | ICD-10-CM | POA: Diagnosis present

## 2021-11-22 DIAGNOSIS — R000 Tachycardia, unspecified: Secondary | ICD-10-CM | POA: Insufficient documentation

## 2021-11-22 DIAGNOSIS — S01511A Laceration without foreign body of lip, initial encounter: Secondary | ICD-10-CM | POA: Diagnosis not present

## 2021-11-22 DIAGNOSIS — R0789 Other chest pain: Secondary | ICD-10-CM | POA: Insufficient documentation

## 2021-11-22 DIAGNOSIS — R519 Headache, unspecified: Secondary | ICD-10-CM | POA: Diagnosis not present

## 2021-11-22 MED ORDER — LIDOCAINE-EPINEPHRINE 2 %-1:100000 IJ SOLN
1.7000 mL | Freq: Once | INTRAMUSCULAR | Status: AC
  Administered 2021-11-22: 1.7 mL

## 2021-11-22 MED ORDER — OXYCODONE HCL 5 MG PO TABS
10.0000 mg | ORAL_TABLET | Freq: Once | ORAL | Status: AC
  Administered 2021-11-22: 10 mg via ORAL
  Filled 2021-11-22: qty 2

## 2021-11-22 MED ORDER — LIDOCAINE-EPINEPHRINE 2 %-1:100000 IJ SOLN
1.7000 mL | Freq: Once | INTRAMUSCULAR | Status: AC
  Administered 2021-11-22: 1.7 mL
  Filled 2021-11-22: qty 1.7

## 2021-11-22 NOTE — Discharge Instructions (Addendum)
-  You may continue to eat/drink as usual, though recommend soft foods for the first 24 to 48 hours.  Avoid any exaggerated facial expressions. ?-You may take Tylenol/ibuprofen as needed for the pain. ?-Return to the emergency department anytime if you begin to experience any new or worsening symptoms ?
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
## 2022-03-24 ENCOUNTER — Emergency Department: Payer: Medicaid Other

## 2022-03-24 ENCOUNTER — Emergency Department
Admission: EM | Admit: 2022-03-24 | Discharge: 2022-03-24 | Discharge status: Against Medical Advice | Payer: Medicaid Other | Attending: Emergency Medicine | Admitting: Emergency Medicine

## 2022-03-24 ENCOUNTER — Other Ambulatory Visit: Payer: Self-pay

## 2022-03-24 DIAGNOSIS — R0602 Shortness of breath: Secondary | ICD-10-CM | POA: Diagnosis not present

## 2022-03-24 DIAGNOSIS — F1010 Alcohol abuse, uncomplicated: Secondary | ICD-10-CM | POA: Insufficient documentation

## 2022-03-24 DIAGNOSIS — R079 Chest pain, unspecified: Secondary | ICD-10-CM | POA: Diagnosis not present

## 2022-03-24 DIAGNOSIS — Y908 Blood alcohol level of 240 mg/100 ml or more: Secondary | ICD-10-CM | POA: Insufficient documentation

## 2022-03-24 DIAGNOSIS — R059 Cough, unspecified: Secondary | ICD-10-CM | POA: Insufficient documentation

## 2022-03-24 DIAGNOSIS — Z5321 Procedure and treatment not carried out due to patient leaving prior to being seen by health care provider: Secondary | ICD-10-CM | POA: Diagnosis not present

## 2022-03-24 LAB — CBC
HCT: 37.4 % (ref 36.0–46.0)
Hemoglobin: 11.8 g/dL — ABNORMAL LOW (ref 12.0–15.0)
MCH: 28.6 pg (ref 26.0–34.0)
MCHC: 31.6 g/dL (ref 30.0–36.0)
MCV: 90.8 fL (ref 80.0–100.0)
Platelets: 441 10*3/uL — ABNORMAL HIGH (ref 150–400)
RBC: 4.12 MIL/uL (ref 3.87–5.11)
RDW: 15.2 % (ref 11.5–15.5)
WBC: 14.3 10*3/uL — ABNORMAL HIGH (ref 4.0–10.5)
nRBC: 0 % (ref 0.0–0.2)

## 2022-03-24 LAB — COMPREHENSIVE METABOLIC PANEL
ALT: 12 U/L (ref 0–44)
AST: 20 U/L (ref 15–41)
Albumin: 4.3 g/dL (ref 3.5–5.0)
Alkaline Phosphatase: 73 U/L (ref 38–126)
Anion gap: 12 (ref 5–15)
BUN: 6 mg/dL (ref 6–20)
CO2: 23 mmol/L (ref 22–32)
Calcium: 8.9 mg/dL (ref 8.9–10.3)
Chloride: 107 mmol/L (ref 98–111)
Creatinine, Ser: 0.78 mg/dL (ref 0.44–1.00)
GFR, Estimated: >60 mL/min (ref >60)
Glucose, Bld: 94 mg/dL (ref 70–99)
Potassium: 3.4 mmol/L — ABNORMAL LOW (ref 3.5–5.1)
Sodium: 142 mmol/L (ref 135–145)
Total Bilirubin: 0.5 mg/dL (ref 0.3–1.2)
Total Protein: 8.2 g/dL — ABNORMAL HIGH (ref 6.5–8.1)

## 2022-03-24 LAB — ETHANOL: Alcohol, Ethyl (B): 263 mg/dL — ABNORMAL HIGH (ref <10)

## 2022-03-24 LAB — LIPASE, BLOOD: Lipase: 47 U/L (ref 11–51)

## 2022-03-24 LAB — TROPONIN I (HIGH SENSITIVITY): Troponin I (High Sensitivity): 5 ng/L (ref <18)

## 2022-03-24 NOTE — ED Triage Notes (Signed)
Pt presents to ER with c/o cough for several months, and states tonight her chest began to hurt when coughing.  Pt states pain is in central chest and non radiating.  Pt denies any hx of heart problems.  Pt endorses sob at this time.  Pt is A&O x4 at this time in NAD.  Pt endorses some alcohol consumption tonight.

## 2022-03-24 NOTE — ED Notes (Signed)
Pt observed leaving the lobby and has not returned.  Called for repeat trop and no answer

## 2022-04-01 ENCOUNTER — Other Ambulatory Visit: Payer: Self-pay

## 2022-04-01 ENCOUNTER — Encounter: Payer: Self-pay | Admitting: Emergency Medicine

## 2022-04-01 ENCOUNTER — Emergency Department: Payer: Medicaid Other

## 2022-04-01 ENCOUNTER — Emergency Department
Admission: EM | Admit: 2022-04-01 | Discharge: 2022-04-01 | Disposition: A | Discharge status: Home / Self Care | Payer: Medicaid Other | Attending: Emergency Medicine | Admitting: Emergency Medicine

## 2022-04-01 DIAGNOSIS — Y907 Blood alcohol level of 200-239 mg/100 ml: Secondary | ICD-10-CM | POA: Diagnosis not present

## 2022-04-01 DIAGNOSIS — Z716 Tobacco abuse counseling: Secondary | ICD-10-CM | POA: Diagnosis not present

## 2022-04-01 DIAGNOSIS — Z20822 Contact with and (suspected) exposure to covid-19: Secondary | ICD-10-CM | POA: Diagnosis not present

## 2022-04-01 DIAGNOSIS — R079 Chest pain, unspecified: Secondary | ICD-10-CM | POA: Diagnosis present

## 2022-04-01 DIAGNOSIS — F1010 Alcohol abuse, uncomplicated: Secondary | ICD-10-CM | POA: Diagnosis not present

## 2022-04-01 DIAGNOSIS — J690 Pneumonitis due to inhalation of food and vomit: Secondary | ICD-10-CM

## 2022-04-01 DIAGNOSIS — Z720 Tobacco use: Secondary | ICD-10-CM

## 2022-04-01 DIAGNOSIS — J189 Pneumonia, unspecified organism: Secondary | ICD-10-CM | POA: Diagnosis not present

## 2022-04-01 LAB — BASIC METABOLIC PANEL
Anion gap: 8 (ref 5–15)
BUN: 7 mg/dL (ref 6–20)
CO2: 26 mmol/L (ref 22–32)
Calcium: 8.7 mg/dL — ABNORMAL LOW (ref 8.9–10.3)
Chloride: 108 mmol/L (ref 98–111)
Creatinine, Ser: 0.69 mg/dL (ref 0.44–1.00)
GFR, Estimated: >60 mL/min (ref >60)
Glucose, Bld: 83 mg/dL (ref 70–99)
Potassium: 3.5 mmol/L (ref 3.5–5.1)
Sodium: 142 mmol/L (ref 135–145)

## 2022-04-01 LAB — HEPATIC FUNCTION PANEL
ALT: 10 U/L (ref 0–44)
AST: 17 U/L (ref 15–41)
Albumin: 4 g/dL (ref 3.5–5.0)
Alkaline Phosphatase: 68 U/L (ref 38–126)
Bilirubin, Direct: <0.1 mg/dL (ref 0.0–0.2)
Total Bilirubin: 0.5 mg/dL (ref 0.3–1.2)
Total Protein: 7.7 g/dL (ref 6.5–8.1)

## 2022-04-01 LAB — CBC
HCT: 34.6 % — ABNORMAL LOW (ref 36.0–46.0)
Hemoglobin: 10.7 g/dL — ABNORMAL LOW (ref 12.0–15.0)
MCH: 28.2 pg (ref 26.0–34.0)
MCHC: 30.9 g/dL (ref 30.0–36.0)
MCV: 91.1 fL (ref 80.0–100.0)
Platelets: 375 10*3/uL (ref 150–400)
RBC: 3.8 MIL/uL — ABNORMAL LOW (ref 3.87–5.11)
RDW: 15.5 % (ref 11.5–15.5)
WBC: 8.1 10*3/uL (ref 4.0–10.5)
nRBC: 0 % (ref 0.0–0.2)

## 2022-04-01 LAB — TROPONIN I (HIGH SENSITIVITY)
Troponin I (High Sensitivity): 4 ng/L (ref <18)
Troponin I (High Sensitivity): 3 ng/L (ref <18)

## 2022-04-01 LAB — SARS CORONAVIRUS 2 BY RT PCR: SARS Coronavirus 2 by RT PCR: NEGATIVE

## 2022-04-01 LAB — ETHANOL: Alcohol, Ethyl (B): 237 mg/dL — ABNORMAL HIGH (ref <10)

## 2022-04-01 LAB — LIPASE, BLOOD: Lipase: 43 U/L (ref 11–51)

## 2022-04-01 LAB — PROCALCITONIN: Procalcitonin: <0.1 ng/mL

## 2022-04-01 MED ORDER — ONDANSETRON HCL 4 MG/2ML IJ SOLN
4.0000 mg | Freq: Once | INTRAMUSCULAR | Status: AC
  Administered 2022-04-01: 4 mg via INTRAVENOUS
  Filled 2022-04-01: qty 2

## 2022-04-01 MED ORDER — LACTATED RINGERS IV BOLUS
1000.0000 mL | Freq: Once | INTRAVENOUS | Status: AC
  Administered 2022-04-01: 1000 mL via INTRAVENOUS

## 2022-04-01 MED ORDER — DOXYCYCLINE HYCLATE 100 MG PO CAPS: 100.0000 mg | ORAL_CAPSULE | Freq: Two times a day (BID) | ORAL | 0 refills | Status: AC

## 2022-04-01 MED ORDER — SODIUM CHLORIDE 0.9 % IV SOLN
1.0000 g | Freq: Once | INTRAVENOUS | Status: AC
  Administered 2022-04-01: 1 g via INTRAVENOUS
  Filled 2022-04-01: qty 10

## 2022-04-01 MED ORDER — IOHEXOL 350 MG/ML SOLN
75.0000 mL | Freq: Once | INTRAVENOUS | Status: AC | PRN
  Administered 2022-04-01: 75 mL via INTRAVENOUS

## 2022-04-01 NOTE — ED Provider Notes (Signed)
[facility] Provider Note    Event Date/Time   First MD Initiated Contact with Patient 04/01/22 [phone]     (approximate)   History   Chest Pain   HPI  Tricia Norton is a 51 y.o. female with a past medical history of chronic anemia, tobacco abuse, and alcohol abuse who presents for evaluation of chest pain and cough and some shortness of breath.  She reported in triage that this been going on for several weeks although she tells this examiner that this started sometimes last night.  She states has been spitting up some clear to yellowish phlegm.  She denies any vomiting, abdominal pain, diarrhea, constipation, urinary symptoms, headache, earache, sore throat rash or extremity pain.  She states she has 2 or 3 beers a day and had that amount before coming to the emergency room.  She denies any illicit drugs.  She denies any other significant medical history.  Denies any illicit drug use.    Past Medical History:  Diagnosis Date   Alcohol abuse    Anemia    Broken arms    Broken leg, right, closed, initial encounter      Physical Exam  Triage Vital Signs: ED Triage Vitals  Enc Vitals Group     BP 04/01/22 0138 (!) 141/105     Pulse Rate 04/01/22 0138 (!) 103     Resp 04/01/22 0138 16     Temp 04/01/22 0138 97.8 F (36.6 C)     Temp Source 04/01/22 0138 Oral     SpO2 04/01/22 0138 98 %     Weight --      Height 04/01/22 0134 5\' 6"  (1.676 m)     Head Circumference --      Peak Flow --      Pain Score 04/01/22 0133 0     Pain Loc --      Pain Edu? --      Excl. in [place]? --     Most recent vital signs: Vitals:   04/01/22 0530 04/01/22 0630  BP: 128/89 121/89  Pulse: 79 96  Resp: 20 (!) 23  Temp:    SpO2: 96% 96%    General: Awake is uncomfortable spitting up clear phlegm into emesis bag. CV:  Good peripheral perfusion.  2+ radial pulses Resp:  Normal effort.  Clear bilaterally.  Slightly tachypneic. Abd:  No distention.  Soft  throughout. Other:  Patient does seem mildly intoxicated with slurred speech.   ED Results / Procedures / Treatments  Labs (all labs ordered are listed, but only abnormal results are displayed) Labs Reviewed  BASIC METABOLIC PANEL - Abnormal; Notable for the following components:      Result Value   Calcium 8.7 (*)    All other components within normal limits  CBC - Abnormal; Notable for the following components:   RBC 3.80 (*)    Hemoglobin 10.7 (*)    HCT 34.6 (*)    All other components within normal limits  ETHANOL - Abnormal; Notable for the following components:   Alcohol, Ethyl (B) 237 (*)    All other components within normal limits  SARS CORONAVIRUS 2 BY RT PCR  HEPATIC FUNCTION PANEL  LIPASE, BLOOD  PROCALCITONIN  TROPONIN I (HIGH SENSITIVITY)  TROPONIN I (HIGH SENSITIVITY)     EKG  EKG is remarkable for sinus rhythm with a QTc interval of 480, normal axis, otherwise unremarkable intervals without clearance of acute ischemia or significant arrhythmia.  RADIOLOGY Chest x-ray my interpretation does show some increased haziness at the left base.  No large effusion, pneumothorax, overt CHF or other clear acute process.  I also reviewed radiology interpretation and agree with the findings.  CTA chest on my interpretation without evidence of a large PE, overt edema, or effusion but does show some opacities at the bilateral bases concerning for pneumonia.  I reviewed radiology interpretation and agree with the findings of likely infectious bronchiolitis possibly related to some aspiration.    PROCEDURES  Critical Care performed: No  .1-3 Lead EKG Interpretation  Performed by: Gilles Chiquito, MD Authorized by: Gilles Chiquito, MD     Interpretation: normal     ECG rate assessment: normal     Rhythm: sinus rhythm     Ectopy: none     Conduction: normal     The patient is on the cardiac monitor to evaluate for evidence of arrhythmia and/or significant heart  rate changes.   MEDICATIONS ORDERED IN ED: Medications  lactated ringers bolus 1,000 mL (0 mLs Intravenous Stopped 04/01/22 0633)  cefTRIAXone (ROCEPHIN) 1 g in sodium chloride 0.9 % 100 mL IVPB (0 g Intravenous Stopped 04/01/22 0355)  ondansetron (ZOFRAN) injection 4 mg (4 mg Intravenous Given 04/01/22 0328)  iohexol (OMNIPAQUE) 350 MG/ML injection 75 mL (75 mLs Intravenous Contrast Given 04/01/22 0406)     IMPRESSION / MDM / ASSESSMENT AND PLAN / ED COURSE  I reviewed the triage vital signs and the nursing notes. Patient's presentation is most consistent with acute presentation with potential threat to life or bodily function.                               Differential diagnosis includes, but is not limited to ACS, PE, pneumonia, bronchitis, symptomatic effusion or pericarditis.  EKG is remarkable for sinus rhythm with a QTc interval of 480, normal axis, otherwise unremarkable intervals without clearance of acute ischemia or significant arrhythmia.  Nonelevated troponin x 2 is not suggestive of ACS.  COVID PCR negative.  Procalcitonin undetectable.  BMP without significant electrolyte or metabolic derangements.  CBC without leukocytosis and hemoglobin of 10.7 compared to 11.88 days ago without any other significant derangements.  Serum ethanol 237.  Hepatic function panel unremarkable.  Lipase not suggestive of pancreatitis  Chest x-ray my interpretation does show some increased haziness at the left base.  No large effusion, pneumothorax, overt CHF or other clear acute process.  I also reviewed radiology interpretation and agree with the findings.  CTA chest on my interpretation without evidence of a large PE, overt edema, or effusion but does show some opacities at the bilateral bases concerning for pneumonia.  I reviewed radiology interpretation and agree with the findings of likely infectious bronchiolitis possibly related to some aspiration.   I suspect some aspiration pneumonia and  bronchiolitis.  Patient counseled on tobacco abuse and cessation as well as alcohol abuse.  On reassessment she states she does not drink daily only intermittently I suspect she is intoxicated today from some binge drinking.  I will plan to treat her for aspiration pneumonia and observe until she is more clinically sober.  On serial reassessments patient had steady improvement in her mental status.  On final reassessment she is no longer slurring her speech and able to ambulate with steady gait unassisted.  At this point I think she is clinically sober.  Counseled on the importance of tobacco cessation and  dangers of binge drinking including aspiration pneumonia which I will treat with a course of doxycycline today.  Emphasized importance of close outpatient PCP follow-up.  Discharged in stable condition.  Strict return precautions advised and discussed.      FINAL CLINICAL IMPRESSION(S) / ED DIAGNOSES   Final diagnoses:  Chest pain, unspecified type  Alcohol abuse  Tobacco abuse  Aspiration pneumonia of both lungs, unspecified aspiration pneumonia type, unspecified part of lung (HCC)     Rx / DC Orders   ED Discharge Orders          Ordered    doxycycline (VIBRAMYCIN) 100 MG capsule  2 times daily        04/01/22 0442             Note:  This document was prepared using Dragon voice recognition software and may include unintentional dictation errors.   Gilles Chiquito, MD 04/01/22 [phone]

## 2022-04-01 NOTE — ED Triage Notes (Signed)
Patient in via POV, reports ongoing chest pain at night x weeks, also reports cough.  Denies any other associated symptoms.  Ambulatory to triage, NAD noted at this time.

## 2022-04-06 ENCOUNTER — Emergency Department: Payer: Medicaid Other

## 2022-04-06 DIAGNOSIS — F1010 Alcohol abuse, uncomplicated: Secondary | ICD-10-CM | POA: Insufficient documentation

## 2022-04-06 DIAGNOSIS — R079 Chest pain, unspecified: Secondary | ICD-10-CM | POA: Insufficient documentation

## 2022-04-06 DIAGNOSIS — Y908 Blood alcohol level of 240 mg/100 ml or more: Secondary | ICD-10-CM | POA: Diagnosis not present

## 2022-04-06 DIAGNOSIS — Z5321 Procedure and treatment not carried out due to patient leaving prior to being seen by health care provider: Secondary | ICD-10-CM | POA: Diagnosis not present

## 2022-04-06 LAB — CBC
HCT: 36.5 % (ref 36.0–46.0)
Hemoglobin: 11.2 g/dL — ABNORMAL LOW (ref 12.0–15.0)
MCH: 28.1 pg (ref 26.0–34.0)
MCHC: 30.7 g/dL (ref 30.0–36.0)
MCV: 91.7 fL (ref 80.0–100.0)
Platelets: 375 10*3/uL (ref 150–400)
RBC: 3.98 MIL/uL (ref 3.87–5.11)
RDW: 14.8 % (ref 11.5–15.5)
WBC: 5.1 10*3/uL (ref 4.0–10.5)
nRBC: 0 % (ref 0.0–0.2)

## 2022-04-06 LAB — BASIC METABOLIC PANEL
Anion gap: 6 (ref 5–15)
BUN: <5 mg/dL — ABNORMAL LOW (ref 6–20)
CO2: 24 mmol/L (ref 22–32)
Calcium: 8.6 mg/dL — ABNORMAL LOW (ref 8.9–10.3)
Chloride: 109 mmol/L (ref 98–111)
Creatinine, Ser: 0.49 mg/dL (ref 0.44–1.00)
GFR, Estimated: >60 mL/min (ref >60)
Glucose, Bld: 92 mg/dL (ref 70–99)
Potassium: 3.6 mmol/L (ref 3.5–5.1)
Sodium: 139 mmol/L (ref 135–145)

## 2022-04-06 LAB — ETHANOL: Alcohol, Ethyl (B): 294 mg/dL — ABNORMAL HIGH (ref <10)

## 2022-04-06 LAB — TROPONIN I (HIGH SENSITIVITY): Troponin I (High Sensitivity): <2 ng/L (ref <18)

## 2022-04-06 NOTE — ED Triage Notes (Signed)
Pt presents via POV with complaints of CP after drinking multiple beers today. Pt visibly intoxicated but cooperative in triage. Hx of alcohol abuse - seen here last week for same. Denies SOB.

## 2022-04-07 ENCOUNTER — Emergency Department
Admission: EM | Admit: 2022-04-07 | Discharge: 2022-04-07 | Discharge status: Against Medical Advice | Payer: Medicaid Other | Attending: Emergency Medicine | Admitting: Emergency Medicine

## 2022-04-07 LAB — TROPONIN I (HIGH SENSITIVITY): Troponin I (High Sensitivity): 2 ng/L (ref <18)

## 2022-07-08 ENCOUNTER — Emergency Department: Payer: Medicaid Other

## 2022-07-08 ENCOUNTER — Other Ambulatory Visit: Payer: Self-pay

## 2022-07-08 ENCOUNTER — Emergency Department
Admission: EM | Admit: 2022-07-08 | Discharge: 2022-07-08 | Disposition: A | Discharge status: Home / Self Care | Payer: Medicaid Other | Attending: Emergency Medicine | Admitting: Emergency Medicine

## 2022-07-08 DIAGNOSIS — R251 Tremor, unspecified: Secondary | ICD-10-CM | POA: Diagnosis present

## 2022-07-08 DIAGNOSIS — F1490 Cocaine use, unspecified, uncomplicated: Secondary | ICD-10-CM | POA: Diagnosis not present

## 2022-07-08 DIAGNOSIS — Y906 Blood alcohol level of 120-199 mg/100 ml: Secondary | ICD-10-CM | POA: Insufficient documentation

## 2022-07-08 DIAGNOSIS — R259 Unspecified abnormal involuntary movements: Secondary | ICD-10-CM

## 2022-07-08 DIAGNOSIS — F10920 Alcohol use, unspecified with intoxication, uncomplicated: Secondary | ICD-10-CM

## 2022-07-08 DIAGNOSIS — Z7982 Long term (current) use of aspirin: Secondary | ICD-10-CM | POA: Diagnosis not present

## 2022-07-08 DIAGNOSIS — F10120 Alcohol abuse with intoxication, uncomplicated: Secondary | ICD-10-CM | POA: Diagnosis not present

## 2022-07-08 DIAGNOSIS — N390 Urinary tract infection, site not specified: Secondary | ICD-10-CM | POA: Insufficient documentation

## 2022-07-08 DIAGNOSIS — E872 Acidosis, unspecified: Secondary | ICD-10-CM | POA: Insufficient documentation

## 2022-07-08 LAB — COMPREHENSIVE METABOLIC PANEL
ALT: 11 U/L (ref 0–44)
AST: 22 U/L (ref 15–41)
Albumin: 4.1 g/dL (ref 3.5–5.0)
Alkaline Phosphatase: 66 U/L (ref 38–126)
Anion gap: 12 (ref 5–15)
BUN: 7 mg/dL (ref 6–20)
CO2: 19 mmol/L — ABNORMAL LOW (ref 22–32)
Calcium: 8.8 mg/dL — ABNORMAL LOW (ref 8.9–10.3)
Chloride: 113 mmol/L — ABNORMAL HIGH (ref 98–111)
Creatinine, Ser: 0.55 mg/dL (ref 0.44–1.00)
GFR, Estimated: >60 mL/min (ref >60)
Glucose, Bld: 90 mg/dL (ref 70–99)
Potassium: 4 mmol/L (ref 3.5–5.1)
Sodium: 144 mmol/L (ref 135–145)
Total Bilirubin: 0.7 mg/dL (ref 0.3–1.2)
Total Protein: 8 g/dL (ref 6.5–8.1)

## 2022-07-08 LAB — URINE DRUG SCREEN, QUALITATIVE (ARMC ONLY)
Amphetamines, Ur Screen: NOT DETECTED
Barbiturates, Ur Screen: NOT DETECTED
Benzodiazepine, Ur Scrn: NOT DETECTED
Cannabinoid 50 Ng, Ur ~~LOC~~: NOT DETECTED
Cocaine Metabolite,Ur ~~LOC~~: POSITIVE — AB
MDMA (Ecstasy)Ur Screen: NOT DETECTED
Methadone Scn, Ur: NOT DETECTED
Opiate, Ur Screen: NOT DETECTED
Phencyclidine (PCP) Ur S: NOT DETECTED
Tricyclic, Ur Screen: NOT DETECTED

## 2022-07-08 LAB — CBC WITH DIFFERENTIAL/PLATELET
Abs Immature Granulocytes: 0.02 10*3/uL (ref 0.00–0.07)
Basophils Absolute: 0 10*3/uL (ref 0.0–0.1)
Basophils Relative: 1 %
Eosinophils Absolute: 0.1 10*3/uL (ref 0.0–0.5)
Eosinophils Relative: 1 %
HCT: 37.3 % (ref 36.0–46.0)
Hemoglobin: 12 g/dL (ref 12.0–15.0)
Immature Granulocytes: 1 %
Lymphocytes Relative: 41 %
Lymphs Abs: 1.6 10*3/uL (ref 0.7–4.0)
MCH: 29.3 pg (ref 26.0–34.0)
MCHC: 32.2 g/dL (ref 30.0–36.0)
MCV: 91.2 fL (ref 80.0–100.0)
Monocytes Absolute: 0.3 10*3/uL (ref 0.1–1.0)
Monocytes Relative: 6 %
Neutro Abs: 2 10*3/uL (ref 1.7–7.7)
Neutrophils Relative %: 50 %
Platelets: 218 10*3/uL (ref 150–400)
RBC: 4.09 MIL/uL (ref 3.87–5.11)
RDW: 14.6 % (ref 11.5–15.5)
WBC: 4 10*3/uL (ref 4.0–10.5)
nRBC: 0 % (ref 0.0–0.2)

## 2022-07-08 LAB — URINALYSIS, ROUTINE W REFLEX MICROSCOPIC
Bilirubin Urine: NEGATIVE
Glucose, UA: NEGATIVE mg/dL
Ketones, ur: NEGATIVE mg/dL
Nitrite: NEGATIVE
Protein, ur: NEGATIVE mg/dL
Specific Gravity, Urine: 1.005 (ref 1.005–1.030)
pH: 6 (ref 5.0–8.0)

## 2022-07-08 LAB — TROPONIN I (HIGH SENSITIVITY)
Troponin I (High Sensitivity): 8 ng/L (ref <18)
Troponin I (High Sensitivity): 7 ng/L (ref <18)

## 2022-07-08 LAB — MAGNESIUM: Magnesium: 2.4 mg/dL (ref 1.7–2.4)

## 2022-07-08 LAB — ETHANOL: Alcohol, Ethyl (B): 192 mg/dL — ABNORMAL HIGH (ref <10)

## 2022-07-08 MED ORDER — THIAMINE HCL 100 MG/ML IJ SOLN
100.0000 mg | Freq: Once | INTRAMUSCULAR | Status: AC
  Administered 2022-07-08: 100 mg via INTRAVENOUS
  Filled 2022-07-08: qty 2

## 2022-07-08 MED ORDER — SODIUM CHLORIDE 0.9 % IV BOLUS (SEPSIS)
1000.0000 mL | Freq: Once | INTRAVENOUS | Status: AC
  Administered 2022-07-08: 1000 mL via INTRAVENOUS

## 2022-07-08 MED ORDER — THIAMINE HCL 100 MG PO TABS: 100.0000 mg | ORAL_TABLET | Freq: Every day | ORAL | 3 refills | Status: AC

## 2022-07-08 MED ORDER — FOSFOMYCIN TROMETHAMINE 3 G PO PACK
3.0000 g | PACK | Freq: Once | ORAL | Status: AC
  Administered 2022-07-08: 3 g via ORAL
  Filled 2022-07-08: qty 3

## 2022-07-08 MED ORDER — NEPHRO-VITE RX 1 MG PO TABS: 1.0000 | ORAL_TABLET | Freq: Every day | ORAL | 3 refills | Status: AC

## 2022-07-08 NOTE — ED Triage Notes (Addendum)
Pt comes from home via [facility] c/o bilateral arm shaking. Pt states this started last night. Pt was watching tv then right arm started shaking. NAD at this time.

## 2022-07-08 NOTE — ED Notes (Signed)
Pts Mom contacted about providing a ride. She notes she will be here within the hour.

## 2022-07-08 NOTE — Discharge Instructions (Addendum)
Your cardiac labs today were reassuring.  Your alcohol level was elevated and you were positive for cocaine.  The of the 2 can cause agitation, abnormal movements, chest pain.  I recommend that you stop using illicit drugs and cut down on your alcohol use.  I recommend that she take a multivitamin daily.  He also had a urinary tract infection.  We have given you a dose of antibiotics here and you do not need further antibiotics to go home with.

## 2022-07-08 NOTE — ED Provider Notes (Addendum)
[facility] Provider Note    Event Date/Time   First MD Initiated Contact with Patient 07/08/22 0217     (approximate)   History   Tremors   HPI  Tricia Norton is a 51 y.o. female with history of alcohol abuse who presents to the emergency department with EMS intoxicated stating that she is having involuntary movements of her right arm that started tonight.  She is not able to tell me exactly what time.  Denies headache, head injury, numbness, weakness.  No history of seizure-like activity.  She has never had this before.  Denies any drug use.  Reports she has had occasional chest pain but no shortness of breath.   History provided by patient and EMS.    Past Medical History:  Diagnosis Date   Alcohol abuse    Anemia    Broken arms    Broken leg, right, closed, initial encounter     Past Surgical History:  Procedure Laterality Date   ELBOW SURGERY     TIBIA IM NAIL INSERTION Right 06/20/2017   Procedure: INTRAMEDULLARY (IM) NAIL TIBIAL;  Surgeon: Thornton Park, MD;  Location: [facility] ORS;  Service: Orthopedics;  Laterality: Right;    MEDICATIONS:  Prior to Admission medications   Medication Sig Start Date End Date Taking? Authorizing Provider  aspirin EC 325 MG tablet Take 1 tablet (325 mg total) by mouth 2 (two) times daily. 06/23/17   Thornton Park, MD  ferrous sulfate (FERROUSUL) 325 (65 FE) MG tablet Take 1 tablet (325 mg total) 2 (two) times daily with a meal by mouth. 07/06/17   Jonathon Bellows, MD    Physical Exam   Triage Vital Signs: ED Triage Vitals [07/08/22 0216]  Enc Vitals Group     BP (!) 165/64     Pulse Rate (!) 103     Resp 16     Temp 98 F (36.7 C)     Temp Source Oral     SpO2 100 %     Weight      Height      Head Circumference      Peak Flow      Pain Score 0     Pain Loc      Pain Edu?      Excl. in [place]?     Most recent vital signs: Vitals:   07/08/22 0320 07/08/22 0600  BP:  112/74  Pulse:  96   Resp: 17 16  Temp:    SpO2:  97%    CONSTITUTIONAL: Alert and oriented and responds appropriately to questions.  Chronically ill-appearing.  Slurred speech.  Smells of alcohol. HEAD: Normocephalic, atraumatic EYES: Conjunctivae clear, pupils appear equal, sclera nonicteric ENT: normal nose; moist mucous membranes NECK: Supple, normal ROM CARD: RRR; S1 and S2 appreciated; no murmurs, no clicks, no rubs, no gallops RESP: Normal chest excursion without splinting or tachypnea; breath sounds clear and equal bilaterally; no wheezes, no rhonchi, no rales, no hypoxia or respiratory distress, speaking full sentences ABD/GI: Normal bowel sounds; non-distended; soft, non-tender, no rebound, no guarding, no peritoneal signs BACK: The back appears normal EXT: Normal ROM in all joints; no deformity noted, no edema; no cyanosis SKIN: Normal color for age and race; warm; no rash on exposed skin NEURO: Moves all extremities equally, mild dysarthria due to intoxication, no aphasia, normal strength in all 4 extremities, normal sensation, cranial nerves II through XII intact.  She will occasionally have what appear to be  involuntary movements of the right upper extremity.  When she is resting and no one is in the room or she is asked to do something, distracted these movements stop. PSYCH: The patient's mood and manner are appropriate.   ED Results / Procedures / Treatments   LABS: (all labs ordered are listed, but only abnormal results are displayed) Labs Reviewed  COMPREHENSIVE METABOLIC PANEL - Abnormal; Notable for the following components:      Result Value   Chloride 113 (*)    CO2 19 (*)    Calcium 8.8 (*)    All other components within normal limits  ETHANOL - Abnormal; Notable for the following components:   Alcohol, Ethyl (B) 192 (*)    All other components within normal limits  URINALYSIS, ROUTINE W REFLEX MICROSCOPIC - Abnormal; Notable for the following components:   Color, Urine STRAW  (*)    APPearance HAZY (*)    Hgb urine dipstick SMALL (*)    Leukocytes,Ua LARGE (*)    Bacteria, UA RARE (*)    All other components within normal limits  URINE DRUG SCREEN, QUALITATIVE ([facility] ONLY) - Abnormal; Notable for the following components:   Cocaine Metabolite,Ur [place] POSITIVE (*)    All other components within normal limits  URINE CULTURE  CBC WITH DIFFERENTIAL/PLATELET  MAGNESIUM  TROPONIN I (HIGH SENSITIVITY)  TROPONIN I (HIGH SENSITIVITY)     EKG:  EKG Interpretation  Date/Time:  Thursday July 08 2022 03:20:01 EST Ventricular Rate:  97 PR Interval:  161 QRS Duration: 111 QT Interval:  414 QTC Calculation: 529 R Axis:   87 Text Interpretation: Sinus rhythm Lateral infarct, old ST elevation suggests acute pericarditis Prolonged QT interval Artifact in lead(s) I II III aVR aVL aVF V1 V2 V5 V6 Confirmed by Rochele Raring [id number]) on 07/08/2022 3:56:53 AM         RADIOLOGY: My personal review and interpretation of imaging: CT head, chest x-ray unremarkable.  I have personally reviewed all radiology reports.   CT HEAD WO CONTRAST ( )  Result Date: 07/08/2022 CLINICAL DATA:  Bilateral upper extremity tremors. Stroke suspected. EXAM: CT HEAD WITHOUT CONTRAST TECHNIQUE: Contiguous axial images were obtained from the base of the skull through the vertex without intravenous contrast. RADIATION DOSE REDUCTION: This exam was performed according to the departmental dose-optimization program which includes automated exposure control, adjustment of the mA and/or kV according to patient size and/or use of iterative reconstruction technique. COMPARISON:  11/22/2021 head CT FINDINGS: Brain: No evidence of acute infarction, hemorrhage, hydrocephalus, extra-axial collection or mass lesion/mass effect. Vascular: No hyperdense vessel or unexpected calcification. Skull: Negative for fractures or focal lesions. Sinuses/Orbits: No acute finding. Other: None. IMPRESSION: No acute  intracranial CT findings or interval changes. Electronically Signed   By: Almira Bar M.D.   On: 07/08/2022 03:51   DG Chest Portable 1 View  Result Date: 07/08/2022 CLINICAL DATA:  Chest pain EXAM: PORTABLE CHEST 1 VIEW COMPARISON:  None Available. FINDINGS: The heart size and mediastinal contours are within normal limits. Both lungs are clear. The visualized skeletal structures are unremarkable. IMPRESSION: No active disease. Electronically Signed   By: Helyn Numbers M.D.   On: 07/08/2022 03:43     PROCEDURES:  Critical Care performed: No      .1-3 Lead EKG Interpretation  Performed by: Fayrene Towner, Layla Maw, DO Authorized by: Kippy Gohman, Layla Maw, DO     Interpretation: normal     ECG rate:  95   ECG rate assessment: normal  Rhythm: sinus rhythm     Ectopy: none     Conduction: normal       IMPRESSION / MDM / ASSESSMENT AND PLAN / ED COURSE  I reviewed the triage vital signs and the nursing notes.    Patient here with abnormal movements of the right upper extremity, intoxication, chest pain.  The patient is on the cardiac monitor to evaluate for evidence of arrhythmia and/or significant heart rate changes.   DIFFERENTIAL DIAGNOSIS (includes but not limited to):   Low suspicion for ACS, PE, dissection, pneumonia, pneumothorax.  Patient is intoxicated.  Low suspicion that her abnormal movements are from seizures, stroke.  They seem more voluntary in nature.   Patient's presentation is most consistent with acute presentation with potential threat to life or bodily function.   PLAN: We will obtain CBC, CMP, ethanol level, urinalysis, urine drug screen, chest x-ray, CT head.  We will give IV fluids, thiamine.  Will check chest x-ray, EKG, troponin x2.   MEDICATIONS GIVEN IN ED: Medications  fosfomycin (MONUROL) packet 3 g (has no administration in time range)  sodium chloride 0.9 % bolus 1,000 mL (0 mLs Intravenous Stopped 07/08/22 0351)  thiamine (VITAMIN B1) injection  100 mg (100 mg Intravenous Given 07/08/22 0321)     ED COURSE:  6:26 AM  Pt now awake, alert with no further abnormal movements.  Speech is normal.  Work-up today reveals normal hemoglobin.  Mild metabolic acidosis with normal anion gap.  Normal glucose.  Other electrolytes normal.  Troponin x2 negative.  Alcohol level of 192.  Drug screen positive for cocaine.  Chest x-ray, head CT reviewed and interpreted by myself and the radiologist and show no acute abnormality.  Suspect symptoms secondary to alcohol and cocaine use.  She is able to ambulate, tolerate p.o.  Will discharge with her mother who will pick her up from the ED.  Urine does also appear possibly infected.  We will add on urine culture and give one-time dose of fosfomycin.   At this time, I do not feel there is any life-threatening condition present. I reviewed all nursing notes, vitals, pertinent previous records.  All lab and urine results, EKGs, imaging ordered have been independently reviewed and interpreted by myself.  I reviewed all available radiology reports from any imaging ordered this visit.  Based on my assessment, I feel the patient is safe to be discharged home without further emergent workup and can continue workup as an outpatient as needed. Discussed all findings, treatment plan as well as usual and customary return precautions.  They verbalize understanding and are comfortable with this plan.  Outpatient follow-up has been provided as needed.  All questions have been answered.    CONSULTS:  none   OUTSIDE RECORDS REVIEWED: The patient's last internal medicine note with Grayland Ormond on 05/24/2022.       FINAL CLINICAL IMPRESSION(S) / ED DIAGNOSES   Final diagnoses:  Abnormal movements  Alcoholic intoxication without complication (HCC)  Cocaine use  Acute UTI     Rx / DC Orders   ED Discharge Orders          Ordered    B Complex-C-Folic Acid (B COMPLEX-VITAMIN C-FOLIC ACID) 1 MG tablet  Daily with  breakfast        07/08/22 0629    thiamine (VITAMIN B1) 100 MG tablet  Daily        07/08/22 0629             Note:  This document was prepared using Dragon voice recognition software and may include unintentional dictation errors.   Marieme Mcmackin, Delice Bison, DO 07/08/22 [id number]    Daevon Holdren, Delice Bison, DO 07/08/22 0630

## 2022-07-08 NOTE — ED Notes (Signed)
Pt ambulated to the restroom without assistance

## 2022-07-09 LAB — URINE CULTURE

## 2022-08-31 ENCOUNTER — Emergency Department: Payer: Medicaid Other

## 2022-08-31 ENCOUNTER — Emergency Department
Admission: EM | Admit: 2022-08-31 | Discharge: 2022-08-31 | Discharge status: Against Medical Advice | Payer: Medicaid Other | Attending: Emergency Medicine | Admitting: Emergency Medicine

## 2022-08-31 ENCOUNTER — Encounter: Payer: Self-pay | Admitting: Emergency Medicine

## 2022-08-31 DIAGNOSIS — R259 Unspecified abnormal involuntary movements: Secondary | ICD-10-CM | POA: Insufficient documentation

## 2022-08-31 DIAGNOSIS — Z5321 Procedure and treatment not carried out due to patient leaving prior to being seen by health care provider: Secondary | ICD-10-CM | POA: Insufficient documentation

## 2022-08-31 DIAGNOSIS — R0781 Pleurodynia: Secondary | ICD-10-CM | POA: Diagnosis not present

## 2022-08-31 LAB — CBC
HCT: 37.4 % (ref 36.0–46.0)
Hemoglobin: 11.7 g/dL — ABNORMAL LOW (ref 12.0–15.0)
MCH: 28.9 pg (ref 26.0–34.0)
MCHC: 31.3 g/dL (ref 30.0–36.0)
MCV: 92.3 fL (ref 80.0–100.0)
Platelets: 381 10*3/uL (ref 150–400)
RBC: 4.05 MIL/uL (ref 3.87–5.11)
RDW: 13.6 % (ref 11.5–15.5)
WBC: 4.9 10*3/uL (ref 4.0–10.5)
nRBC: 0 % (ref 0.0–0.2)

## 2022-08-31 LAB — BASIC METABOLIC PANEL
Anion gap: 10 (ref 5–15)
BUN: 6 mg/dL (ref 6–20)
CO2: 25 mmol/L (ref 22–32)
Calcium: 9 mg/dL (ref 8.9–10.3)
Chloride: 107 mmol/L (ref 98–111)
Creatinine, Ser: 0.53 mg/dL (ref 0.44–1.00)
GFR, Estimated: >60 mL/min (ref >60)
Glucose, Bld: 115 mg/dL — ABNORMAL HIGH (ref 70–99)
Potassium: 3.4 mmol/L — ABNORMAL LOW (ref 3.5–5.1)
Sodium: 142 mmol/L (ref 135–145)

## 2022-08-31 LAB — POC URINE PREG, ED: Preg Test, Ur: NEGATIVE

## 2022-08-31 LAB — ETHANOL: Alcohol, Ethyl (B): 161 mg/dL — ABNORMAL HIGH (ref <10)

## 2022-08-31 LAB — TROPONIN I (HIGH SENSITIVITY): Troponin I (High Sensitivity): 4 ng/L (ref <18)

## 2022-08-31 NOTE — ED Notes (Signed)
Call x2 with no answer from lobby. 

## 2022-08-31 NOTE — ED Triage Notes (Signed)
Pt presents via [facility] with complaints of midsternal CP that started tonight. Pt endorses alcohol consumption and marijuana use tonight. She also notes having "abnormal movements" of her arms and legs. Pt able to stop the movements when touched. Denies fevers, chills, N/V/D, urinary sx, SOB.

## 2022-08-31 NOTE — ED Notes (Signed)
Called x1 with no answer from lobby.

## 2022-08-31 NOTE — ED Notes (Signed)
Call x3 with no answer from lobby 

## 2022-09-13 ENCOUNTER — Other Ambulatory Visit: Payer: Self-pay | Admitting: Family Medicine

## 2022-09-13 DIAGNOSIS — Z1231 Encounter for screening mammogram for malignant neoplasm of breast: Secondary | ICD-10-CM

## 2022-09-15 ENCOUNTER — Telehealth: Payer: Self-pay

## 2022-09-15 ENCOUNTER — Other Ambulatory Visit: Payer: Self-pay

## 2022-09-15 DIAGNOSIS — Z1211 Encounter for screening for malignant neoplasm of colon: Secondary | ICD-10-CM

## 2022-09-15 MED ORDER — NA SULFATE-K SULFATE-MG SULF 17.5-3.13-1.6 GM/177ML PO SOLN: 1.0000 | Freq: Once | ORAL | 0 refills | Status: AC

## 2022-09-15 NOTE — Telephone Encounter (Signed)
Gastroenterology Pre-Procedure Review  Request Date: 10/25/22 Requesting Physician: Dr. Marius Ditch  PATIENT REVIEW QUESTIONS: The patient responded to the following health history questions as indicated:    1. Are you having any GI issues? no 2. Do you have a personal history of Polyps? no 3. Do you have a family history of Colon Cancer or Polyps? no 4. Diabetes Mellitus? no 5. Joint replacements in the past 12 months? 2 broken arms and 2 broken legs 6. Major health problems in the past 3 months? 08/31/22 ER Visit Chest Pain Pt does not have a cardiologist 7. Any artificial heart valves, MVP, or defibrillator?no    MEDICATIONS & ALLERGIES:    Patient reports the following regarding taking any anticoagulation/antiplatelet therapy:   Plavix, Coumadin, Eliquis, Xarelto, Lovenox, Pradaxa, Brilinta, or Effient? no Aspirin? no  Patient confirms/reports the following medications:  Current Outpatient Medications  Medication Sig Dispense Refill   aspirin EC 325 MG tablet Take 1 tablet (325 mg total) by mouth 2 (two) times daily. 60 tablet 0   B Complex-C-Folic Acid (B COMPLEX-VITAMIN C-FOLIC ACID) 1 MG tablet Take 1 tablet by mouth daily with breakfast. 90 tablet 3   ferrous sulfate (FERROUSUL) 325 (65 FE) MG tablet Take 1 tablet (325 mg total) 2 (two) times daily with a meal by mouth. 120 tablet 3   thiamine (VITAMIN B1) 100 MG tablet Take 1 tablet (100 mg total) by mouth daily. 90 tablet 3   No current facility-administered medications for this visit.    Patient confirms/reports the following allergies:  No Known Allergies  No orders of the defined types were placed in this encounter.   AUTHORIZATION INFORMATION Primary Insurance: 1D#: Group #:  Secondary Insurance: 1D#: Group #:  SCHEDULE INFORMATION: Date: 10/25/22 Time: Location: [facility]

## 2022-09-23 ENCOUNTER — Encounter: Payer: Self-pay | Admitting: Gastroenterology

## 2022-09-30 ENCOUNTER — Telehealth: Payer: Self-pay

## 2022-09-30 NOTE — Telephone Encounter (Signed)
Patient colonoscopy instructions that were mailed out on 09/15/2022 were return because unable to delivered.

## 2022-09-30 NOTE — Telephone Encounter (Signed)
Updated patients address in chart instructions printed and mailed.  Thanks, [place], [place]

## 2022-10-22 ENCOUNTER — Encounter: Payer: Self-pay | Admitting: Gastroenterology

## 2022-10-25 ENCOUNTER — Ambulatory Visit: Admission: RE | Admit: 2022-10-25 | Payer: Medicaid Other | Source: Home / Self Care | Admitting: Gastroenterology

## 2022-10-25 ENCOUNTER — Other Ambulatory Visit: Payer: Self-pay

## 2022-10-25 ENCOUNTER — Encounter: Payer: Self-pay | Admitting: Certified Registered"

## 2022-10-25 ENCOUNTER — Telehealth: Payer: Self-pay

## 2022-10-25 ENCOUNTER — Encounter: Admission: RE | Payer: Self-pay | Source: Home / Self Care

## 2022-10-25 DIAGNOSIS — Z1211 Encounter for screening for malignant neoplasm of colon: Secondary | ICD-10-CM

## 2022-10-25 SURGERY — COLONOSCOPY WITH PROPOFOL
Anesthesia: General

## 2022-10-25 NOTE — Telephone Encounter (Signed)
Trish from ENDO called and left a voicemail on my phone stating patient did not show up for colonoscopy today. She states leslie called and gave patient a time on Friday and she said that her prep was expensive and does not know if she has the instructions for the procedure. She told her to contact our office to see if we could change the prep. Can you please call patient to get this reschedule

## 2022-10-25 NOTE — Telephone Encounter (Signed)
Received the following message from Dr. Marius Ditch, "This patient did not come for colonoscopy today because apparently when she went to pick up prescription yesterday, Medicaid did not cover the prep that was sent". Dr. Marius Ditch advised the following, "Reschedule and please give her generic prep or sample kit".   Pt has been rescheduled to 11/10/22. ClenPiq sample provided to patient.  Her mother said they will pick it up on tomorrow or Wednesday.  I will leave it at the front desk along with instructions.  Thanks,  [place], [place]

## 2022-11-10 ENCOUNTER — Ambulatory Visit: Admission: RE | Admit: 2022-11-10 | Payer: Medicaid Other | Source: Home / Self Care | Admitting: Gastroenterology

## 2022-11-10 ENCOUNTER — Encounter: Admission: RE | Payer: Self-pay | Source: Home / Self Care

## 2022-11-10 ENCOUNTER — Telehealth: Payer: Self-pay

## 2022-11-10 SURGERY — COLONOSCOPY WITH PROPOFOL
Anesthesia: General

## 2022-11-10 NOTE — Telephone Encounter (Signed)
Patient no show for colonoscopy today on 11/10/2022 and then no show on 10/25/2022 for colonoscopy. Called patient to find out why she did not show up for her procedure today. Asked patient why she did not show up for her procedure and she said it was because she did not have a ride. Asked patient if she called to let us know and she said no. Informed patient that she might get charge 100 dollars for no showing her procedure and she said okay but she will not pay it. Informed patient that we could reschedule one last time but if she no shows that appointment she will not be able to reschedule. She states she does not want to have a colonoscopy done and will not reschedule the procedure.

## 2023-10-07 IMAGING — CT CT HEAD W/O CM
4 series · 16 of 47 positions shown, 18 images · non-contrast
Comparison: 11/02/2014 CTs

CLINICAL DATA: 50-year-old female with head and neck injury from
fall. Initial encounter.



[Series 2: head wo · axial · 0.39mm/px · z∈[-118,+28]mm · 7 of 39 slices shown, 9 images]
[im 5/39  brain]
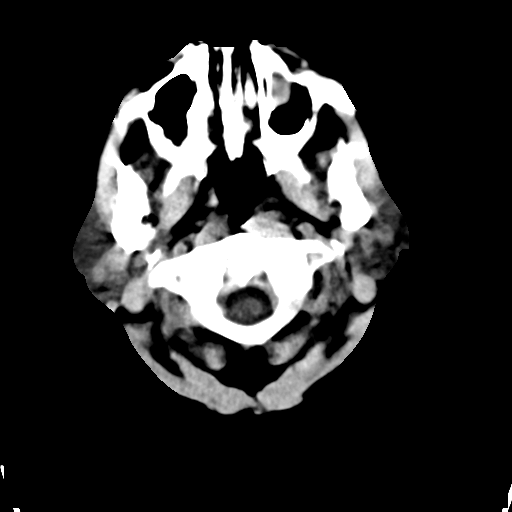
[im 5/39  bone]
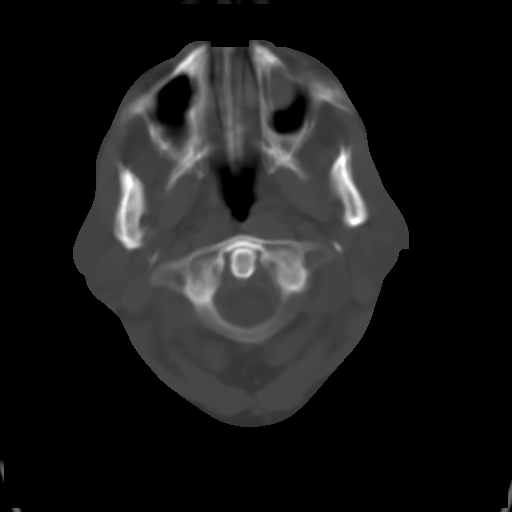
[im 10/39  brain]
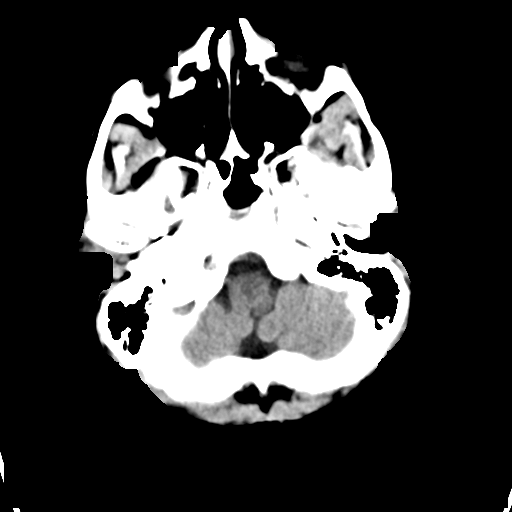
[im 15/39  brain]
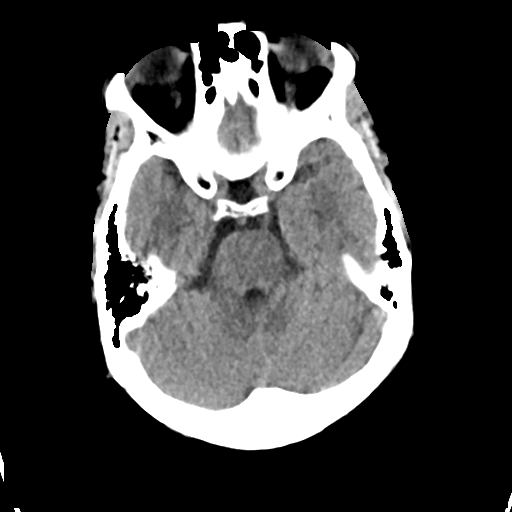
[im 20/39  brain]
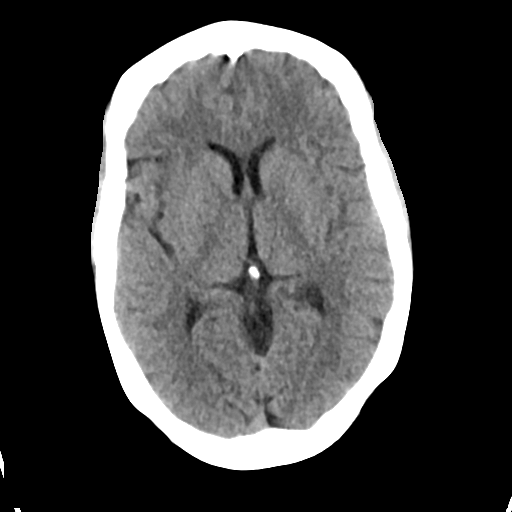
[im 24/39  brain]
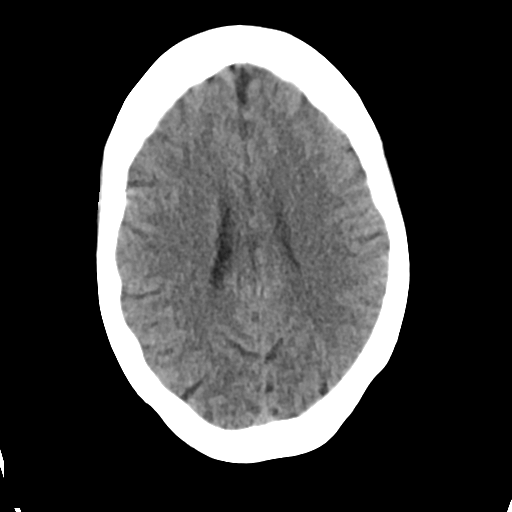
[im 24/39  bone]
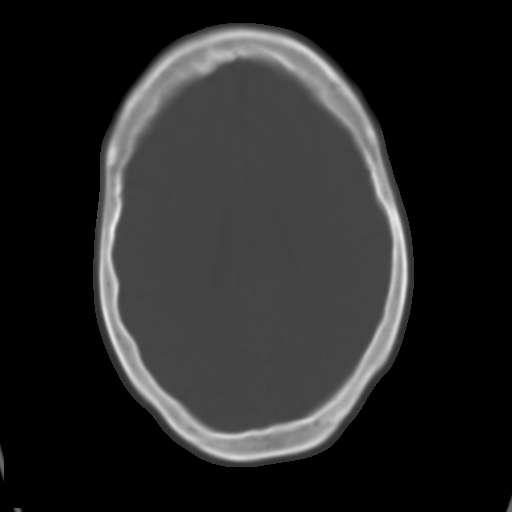
[im 29/39  brain]
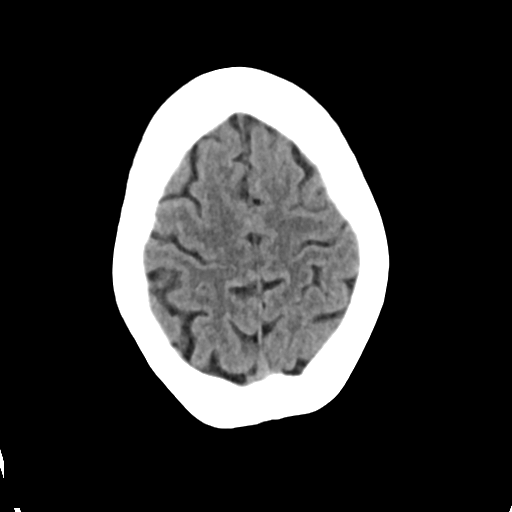
[im 34/39  brain]
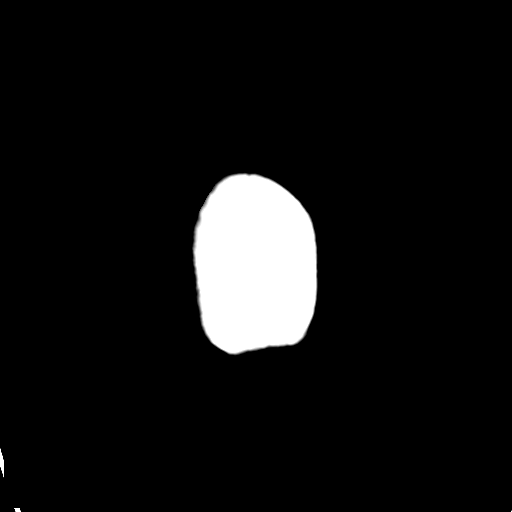

[Series 3: head bone · axial · 0.39mm/px · z∈[-120,-82]mm · 3 of 96 slices shown]
[im 10/96  bone]
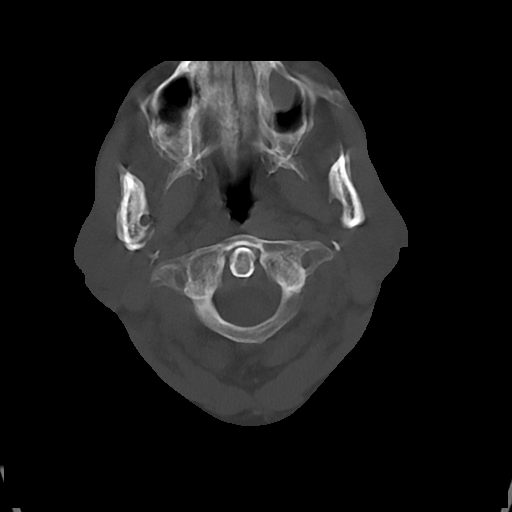
[im 20/96  bone]
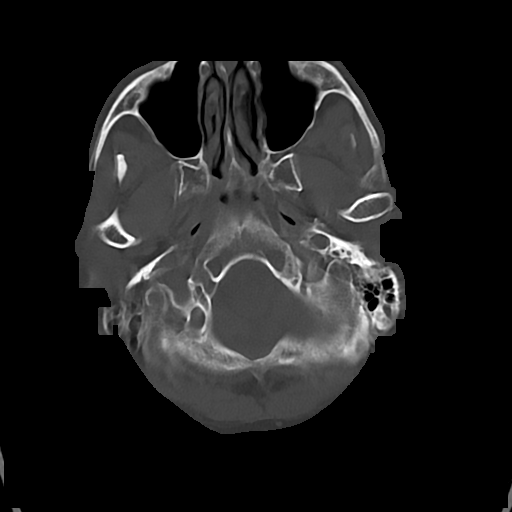
[im 29/96  bone]
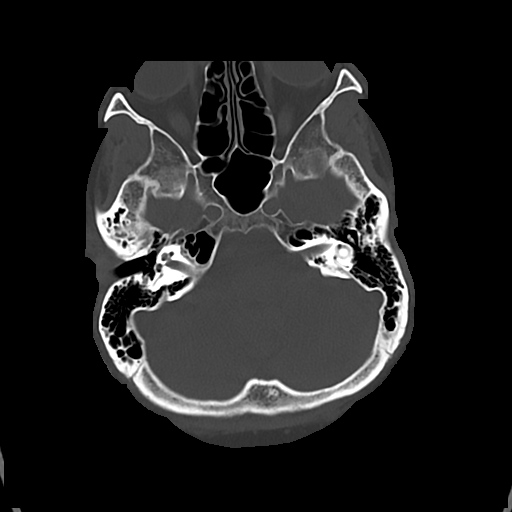

[Series 4: cor soft · coronal · 0.34mm/px · 3 of 68 slices shown]
[im 23/68  brain]
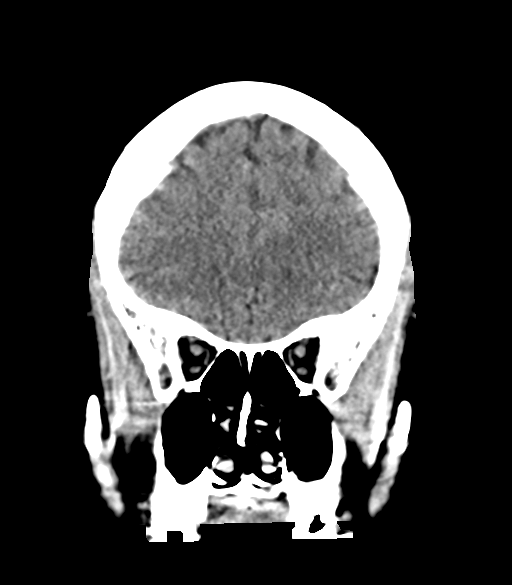
[im 30/68  brain]
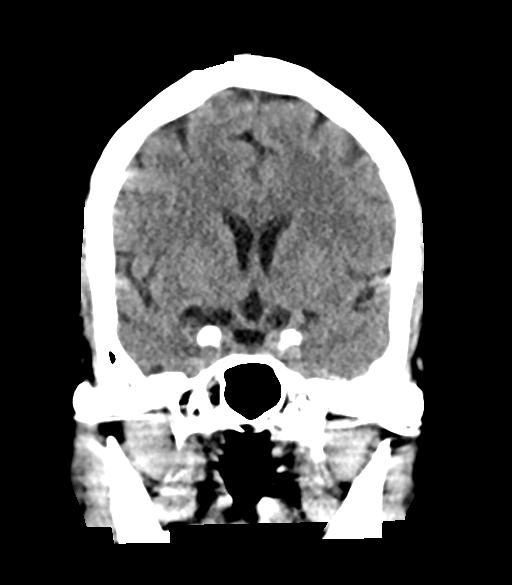
[im 38/68  brain]
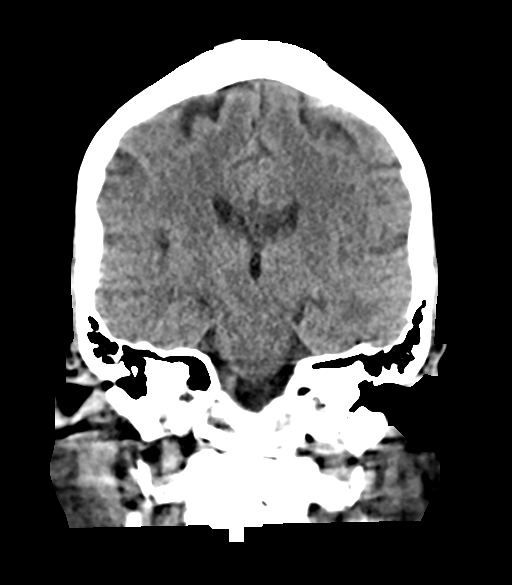

[Series 5: sag soft · sagittal · 0.37mm/px · 3 of 53 slices shown]
[im 18/53  brain]
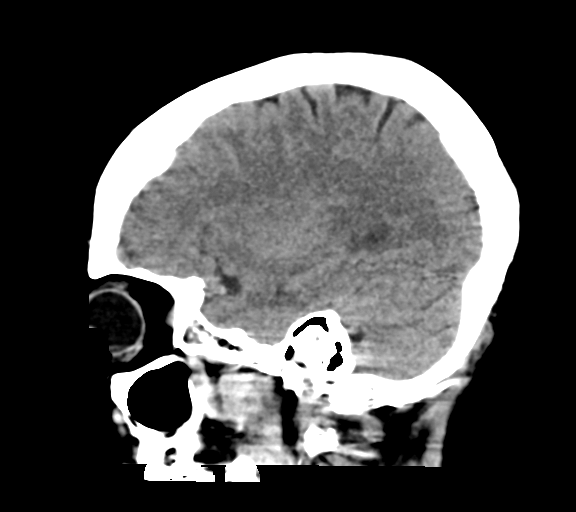
[im 27/53  brain]
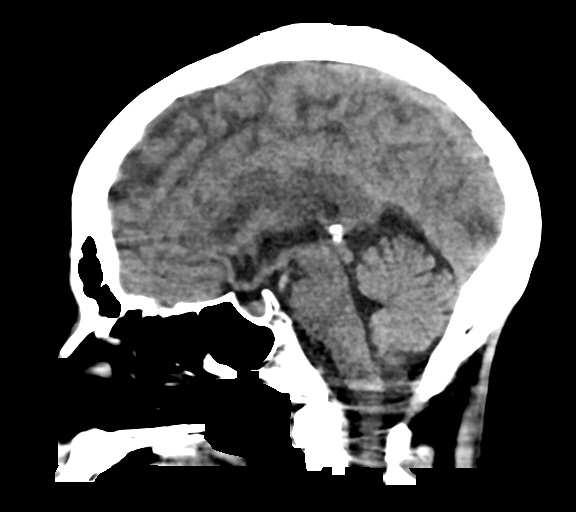
[im 35/53  brain]
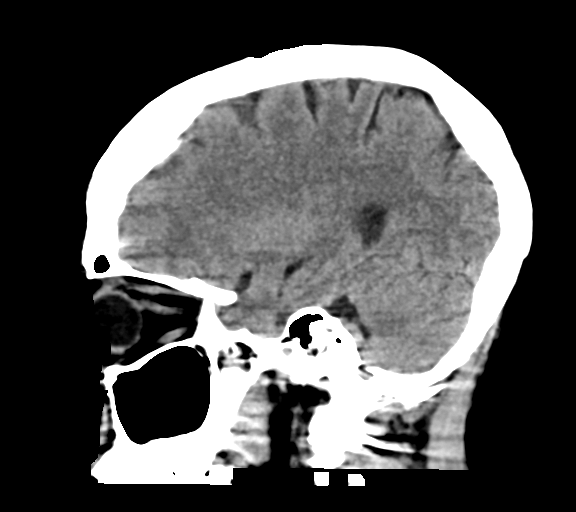

[16 of 47 positions shown; findings below may reference images not displayed]

FINDINGS: CT HEAD FINDINGS

Brain: No evidence of acute infarction, hemorrhage, hydrocephalus,
extra-axial collection or mass lesion/mass effect.

Vascular: No hyperdense vessel or unexpected calcification.

Skull: Normal. Negative for fracture or focal lesion.

Sinuses/Orbits: No acute finding.

Other: None.

CT CERVICAL SPINE FINDINGS

Alignment: Normal.

Skull base and vertebrae: No acute fracture. No primary bone lesion
or focal pathologic process.

Soft tissues and spinal canal: No prevertebral fluid or swelling. No
visible canal hematoma.

Disc levels: Degenerative disc disease/spondylosis at C4-5 and C5-6
are again noted contributing to mild central spinal and bony
foraminal narrowing at these levels.

Upper chest: No acute abnormality

Other: None
IMPRESSION: 1. Unremarkable noncontrast head CT.
2. No static evidence of acute injury to the cervical spine.
Degenerative changes as described.

## 2023-10-07 IMAGING — CR DG CHEST 2V
1 series · 2 of 2 positions shown · non-contrast
Comparison: 06/20/2017 chest radiograph

CLINICAL DATA: Fall and chest pain.

EXAM:
CHEST - 2 VIEW

[Series 1: dg chest 2 view · 0.14mm/px · 2 of 2 slices shown]
[im 1/2]
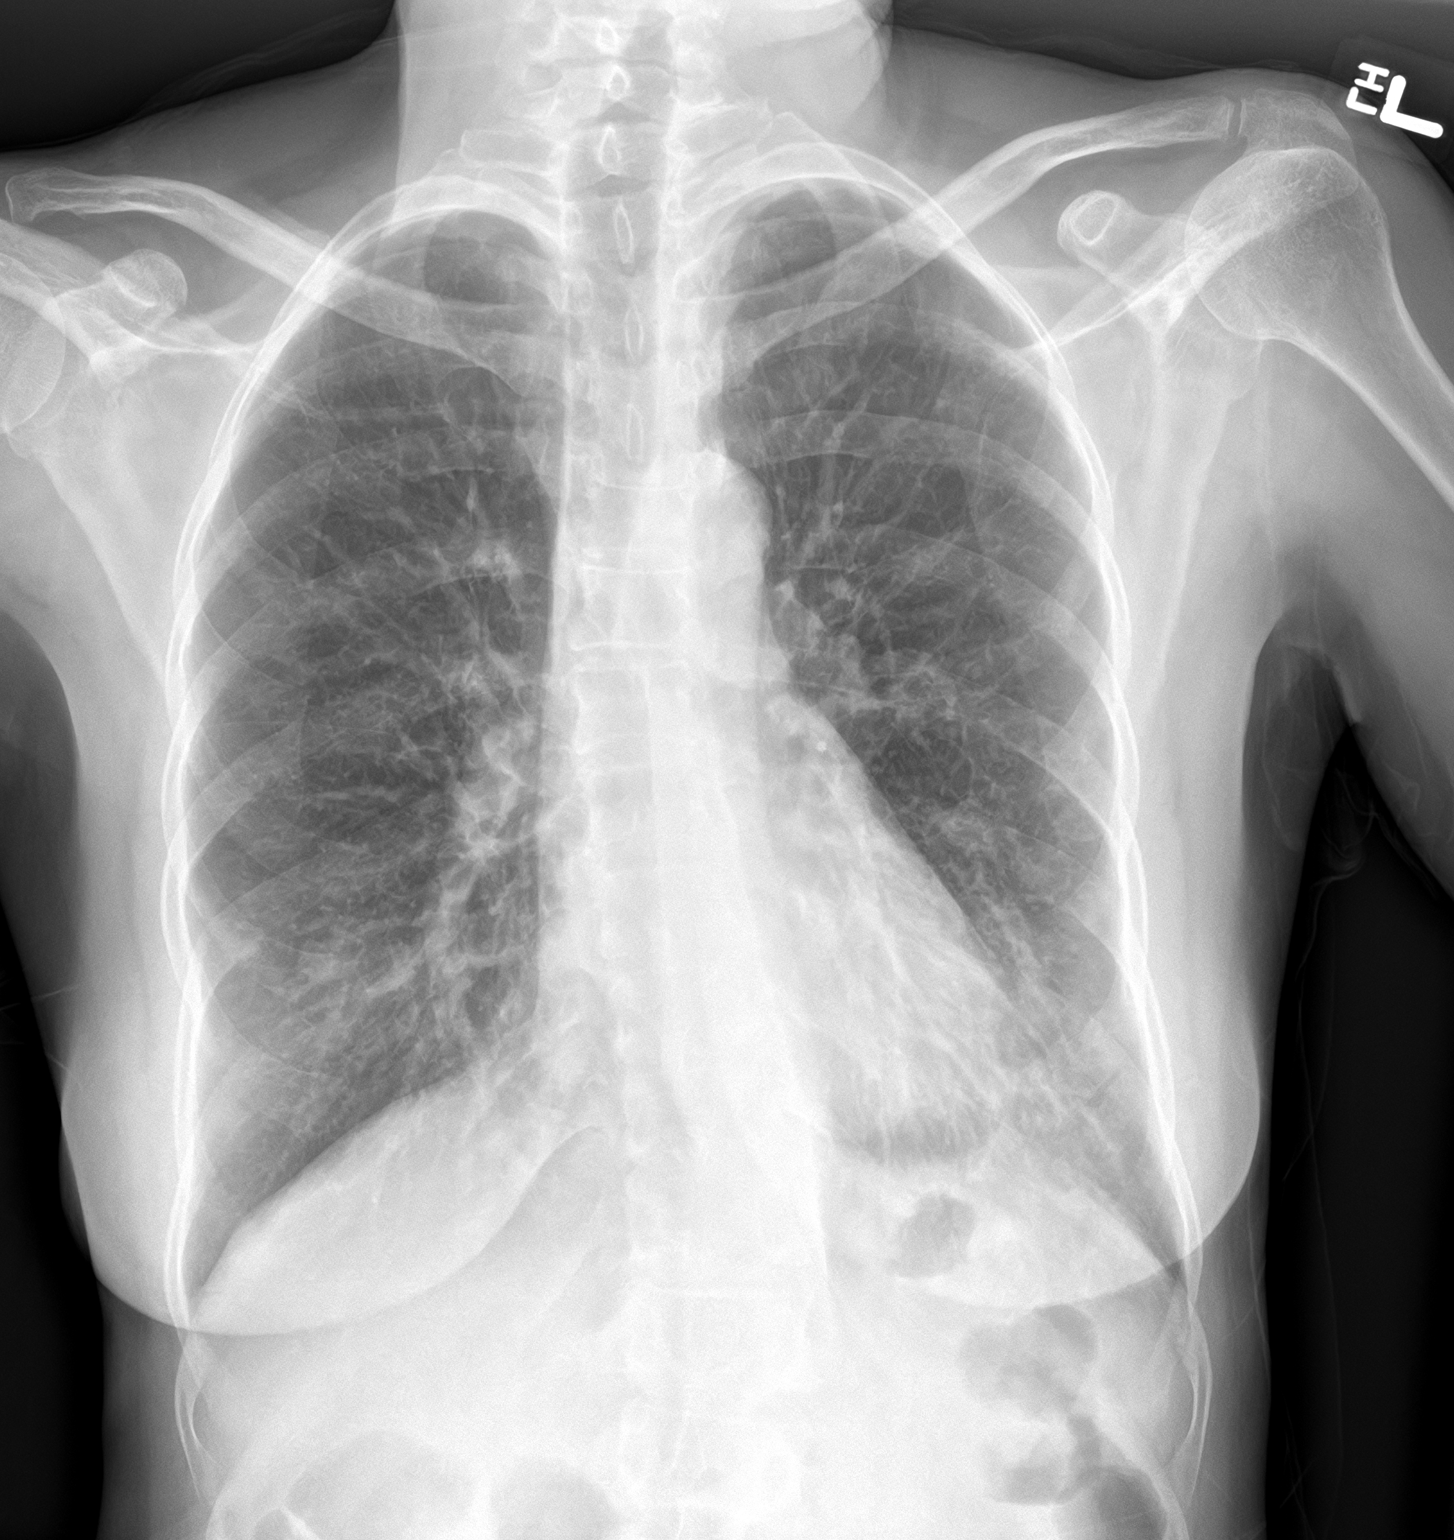
[im 2/2]
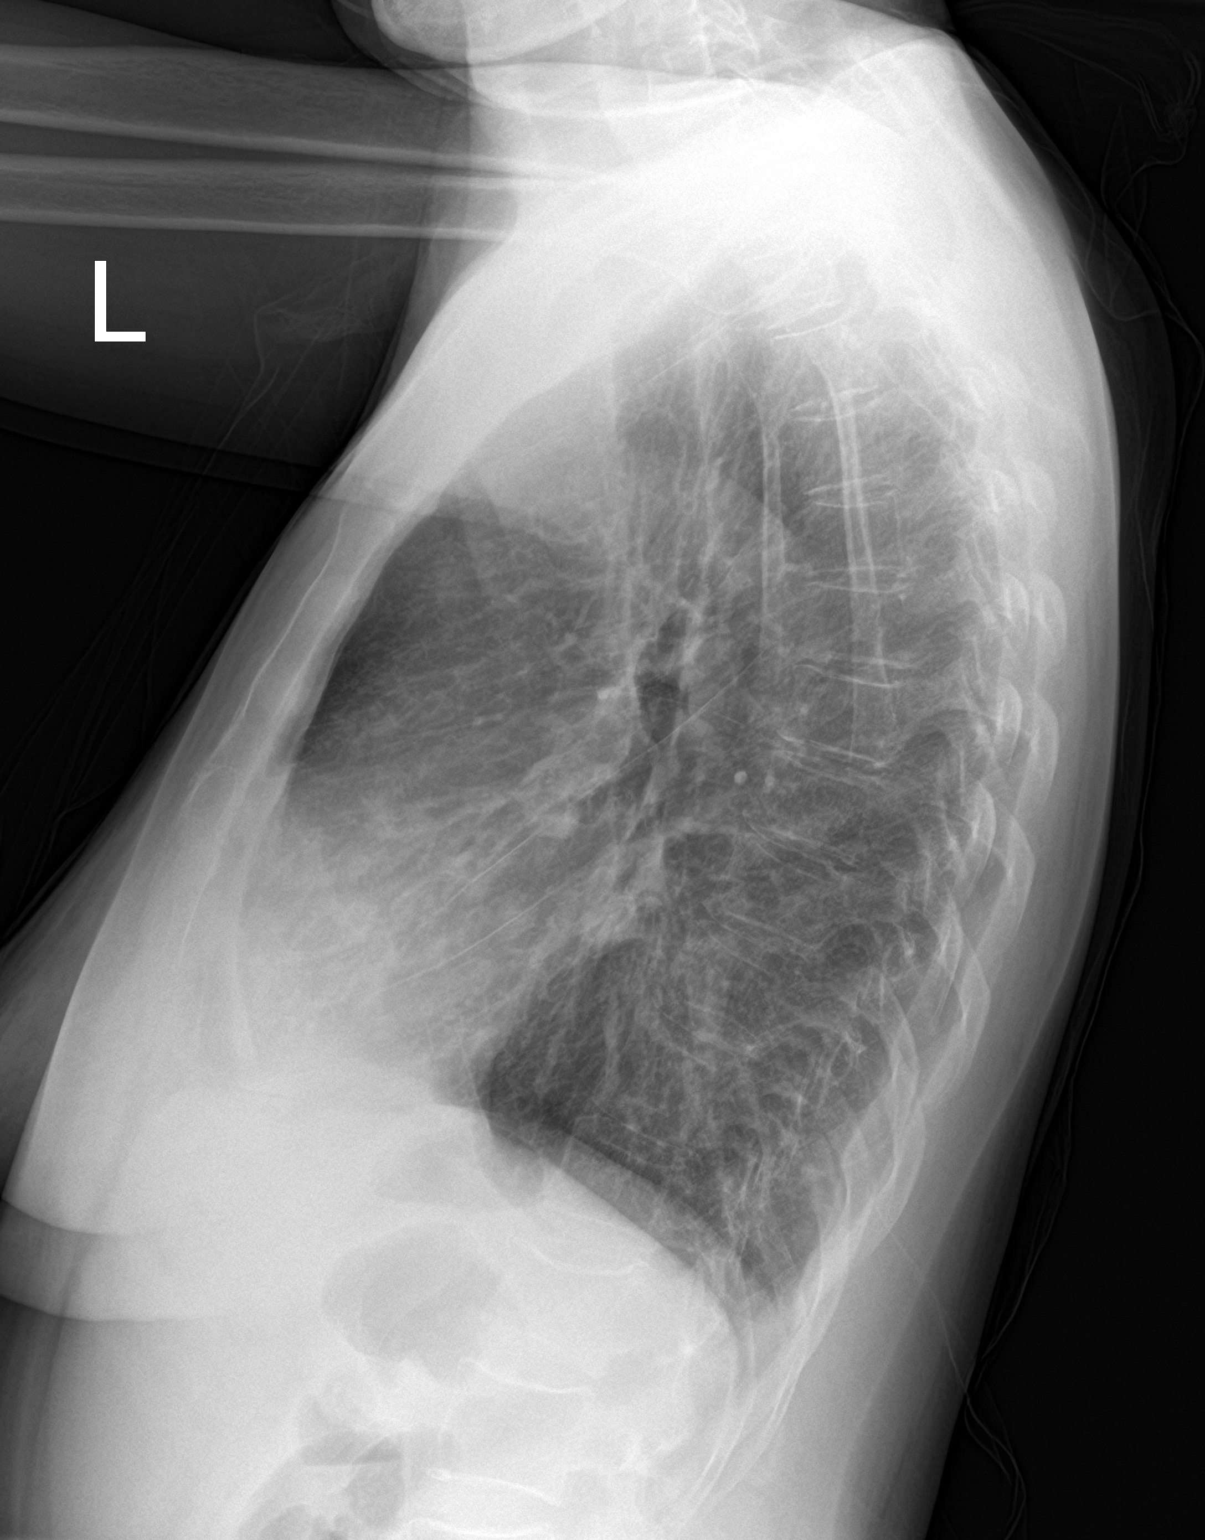

[2 of 2 positions shown; findings below may reference images not displayed]

FINDINGS: The cardiomediastinal silhouette is unremarkable.

Interstitial prominence is again noted.

Streaky lingular opacities are best identified on the LATERAL view
and may represent infection.

There is no evidence of consolidation, mass, pleural effusion or
pneumothorax.

No acute bony abnormalities are identified.
IMPRESSION: No active cardiopulmonary disease.

## 2023-10-07 IMAGING — CT CT CERVICAL SPINE W/O CM
3 of 4 series · 13 of 33 positions shown, 16 images · non-contrast
Comparison: 11/02/2014 CTs

CLINICAL DATA: 50-year-old female with head and neck injury from
fall. Initial encounter.



[Series 6: sag bone · sagittal · 0.41mm/px · 5 of 101 slices shown, 6 images]
[im 34/101  bone]
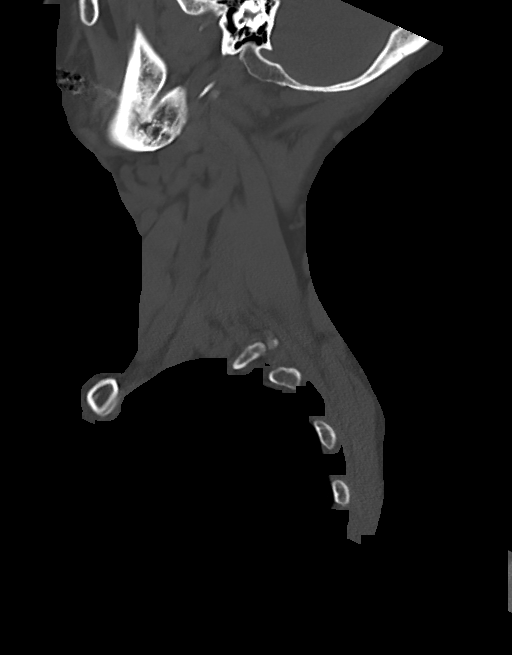
[im 42/101  bone]
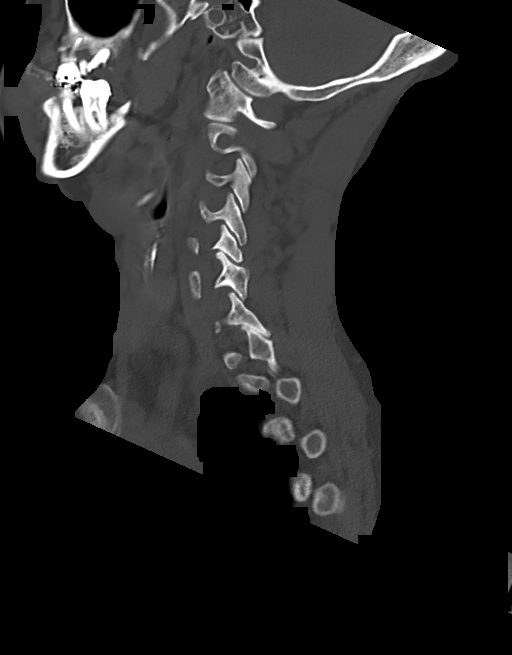
[im 51/101  soft-tissue]
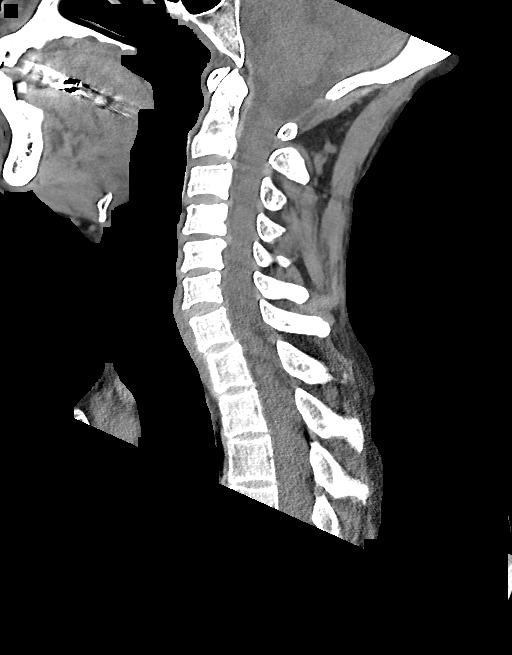
[im 51/101  bone]
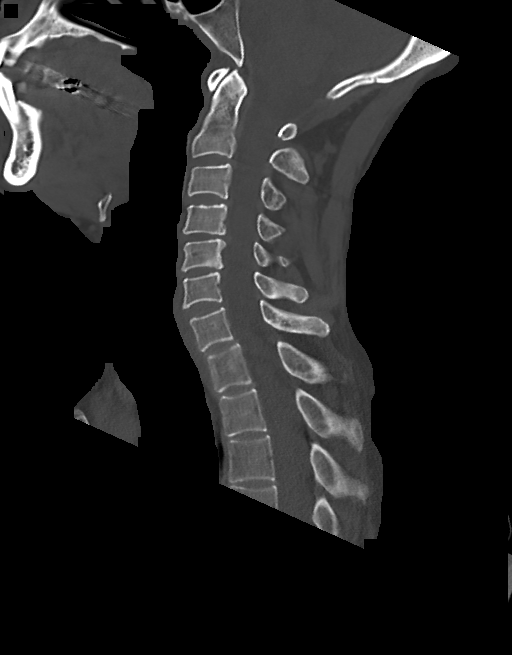
[im 59/101  bone]
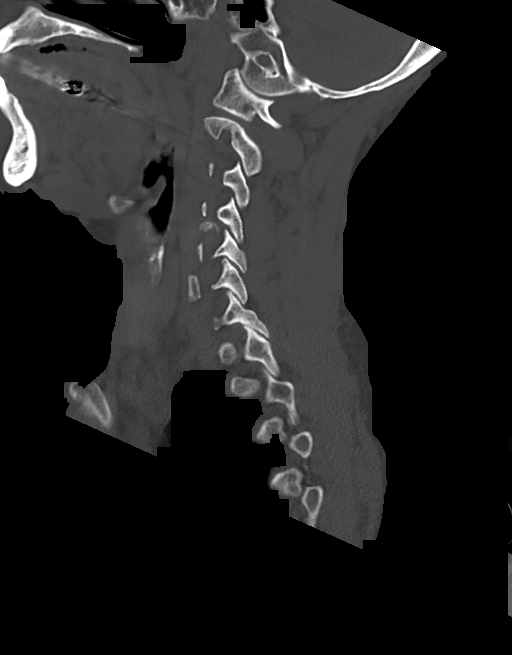
[im 67/101  bone]
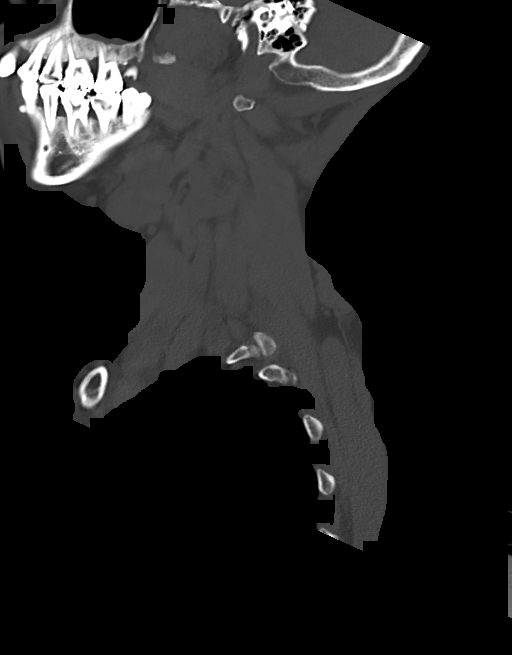

[Series 7: cor bone · coronal · 0.37mm/px · 3 of 125 slices shown]
[im 25/125  bone]
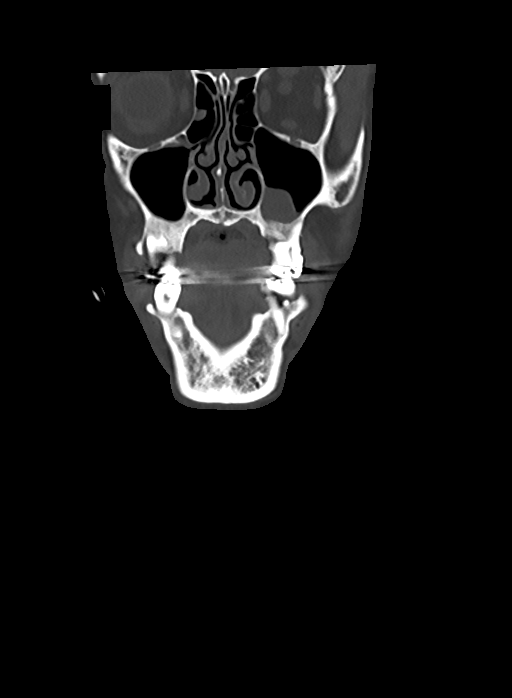
[im 50/125  bone]
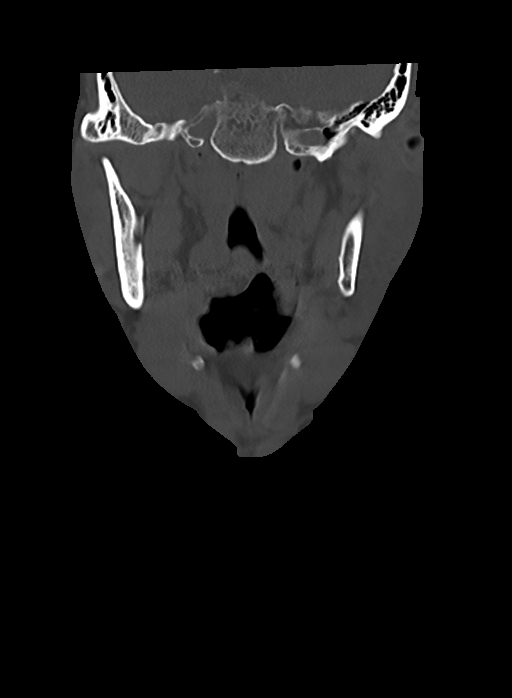
[im 75/125  bone]
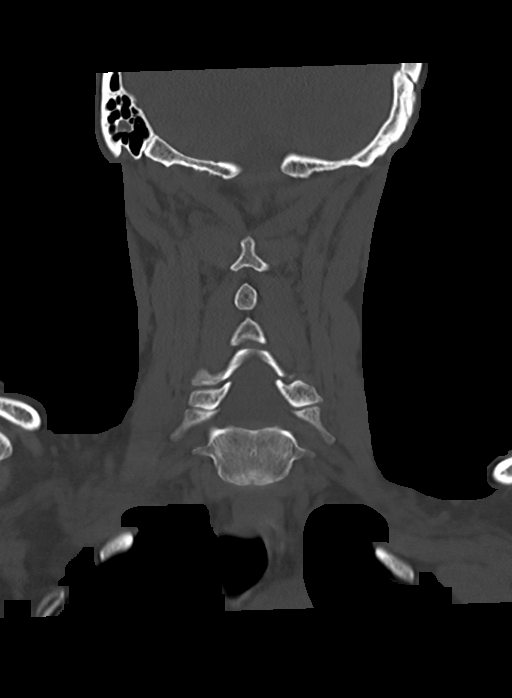

[Series 8: orthogonal axials · axial · 0.34mm/px · z∈[-275,-152]mm · 5 of 100 slices shown, 7 images]
[im 17/100  soft-tissue]
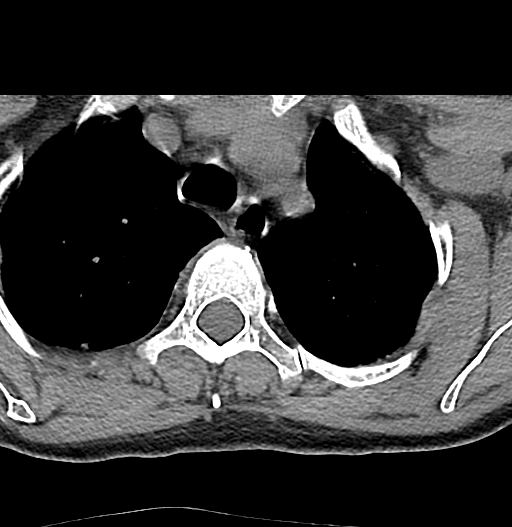
[im 17/100  bone]
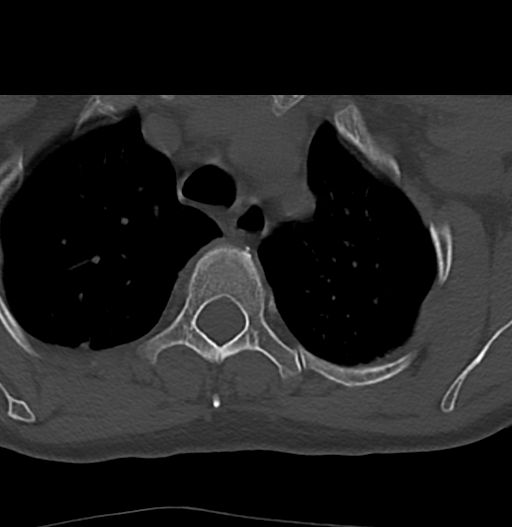
[im 34/100  bone]
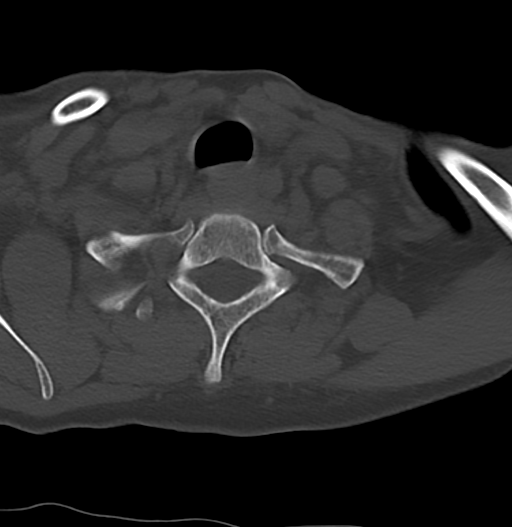
[im 50/100  bone]
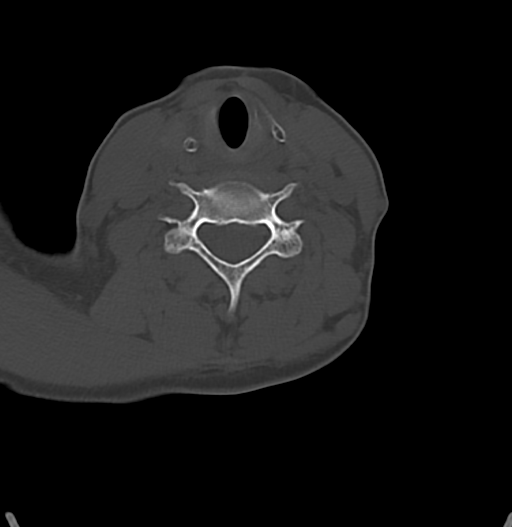
[im 67/100  bone]
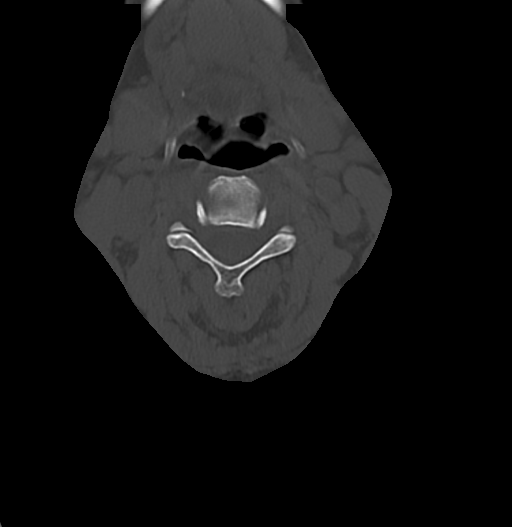
[im 83/100  soft-tissue]
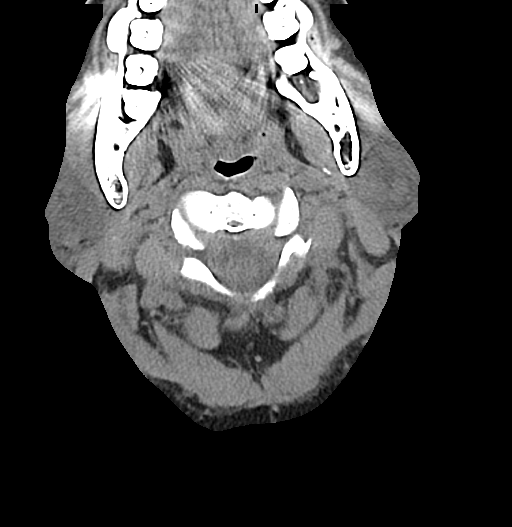
[im 83/100  bone]
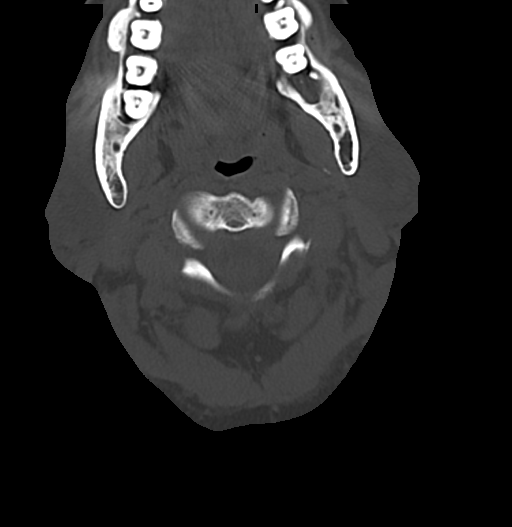

[13 of 33 positions shown; findings below may reference images not displayed]

FINDINGS: CT HEAD FINDINGS

Brain: No evidence of acute infarction, hemorrhage, hydrocephalus,
extra-axial collection or mass lesion/mass effect.

Vascular: No hyperdense vessel or unexpected calcification.

Skull: Normal. Negative for fracture or focal lesion.

Sinuses/Orbits: No acute finding.

Other: None.

CT CERVICAL SPINE FINDINGS

Alignment: Normal.

Skull base and vertebrae: No acute fracture. No primary bone lesion
or focal pathologic process.

Soft tissues and spinal canal: No prevertebral fluid or swelling. No
visible canal hematoma.

Disc levels: Degenerative disc disease/spondylosis at C4-5 and C5-6
are again noted contributing to mild central spinal and bony
foraminal narrowing at these levels.

Upper chest: No acute abnormality

Other: None
IMPRESSION: 1. Unremarkable noncontrast head CT.
2. No static evidence of acute injury to the cervical spine.
Degenerative changes as described.
